# Patient Record
Sex: Female | Born: 1980 | Race: Black or African American | Hispanic: No | Marital: Single | Smoking: Never smoker
Health system: Southern US, Community
[De-identification: ages and names within clinical notes are randomized; demographics above are authoritative.]

## PROBLEM LIST (undated history)

## (undated) DIAGNOSIS — M1990 Unspecified osteoarthritis, unspecified site: Secondary | ICD-10-CM

## (undated) DIAGNOSIS — T7840XA Allergy, unspecified, initial encounter: Secondary | ICD-10-CM

## (undated) DIAGNOSIS — K219 Gastro-esophageal reflux disease without esophagitis: Secondary | ICD-10-CM

## (undated) HISTORY — DX: Allergy, unspecified, initial encounter: T78.40XA

## (undated) HISTORY — DX: Unspecified osteoarthritis, unspecified site: M19.90

---

## 2006-01-14 ENCOUNTER — Emergency Department: Payer: Self-pay | Admitting: Emergency Medicine

## 2007-01-25 ENCOUNTER — Emergency Department: Payer: Self-pay | Admitting: Emergency Medicine

## 2007-11-17 ENCOUNTER — Ambulatory Visit: Payer: Self-pay | Admitting: Unknown Physician Specialty

## 2008-04-11 ENCOUNTER — Emergency Department: Payer: Self-pay | Admitting: Internal Medicine

## 2008-04-28 ENCOUNTER — Ambulatory Visit: Payer: Self-pay

## 2008-05-26 ENCOUNTER — Ambulatory Visit: Payer: Self-pay

## 2010-07-05 ENCOUNTER — Emergency Department: Payer: Self-pay | Admitting: Unknown Physician Specialty

## 2011-07-08 HISTORY — PX: FRACTURE SURGERY: SHX138

## 2011-07-22 DIAGNOSIS — S42309A Unspecified fracture of shaft of humerus, unspecified arm, initial encounter for closed fracture: Secondary | ICD-10-CM | POA: Insufficient documentation

## 2011-07-22 DIAGNOSIS — S42301A Unspecified fracture of shaft of humerus, right arm, initial encounter for closed fracture: Secondary | ICD-10-CM | POA: Insufficient documentation

## 2011-07-22 HISTORY — DX: Unspecified fracture of shaft of humerus, right arm, initial encounter for closed fracture: S42.301A

## 2012-01-03 ENCOUNTER — Emergency Department: Payer: Self-pay | Admitting: Emergency Medicine

## 2012-01-03 LAB — URINALYSIS, COMPLETE
Bilirubin,UR: NEGATIVE
Blood: NEGATIVE
Ketone: NEGATIVE
Ph: 5 (ref 4.5–8.0)
Protein: NEGATIVE
Specific Gravity: 1.025 (ref 1.003–1.030)
WBC UR: 2 /HPF (ref 0–5)

## 2012-01-03 LAB — CBC
HCT: 36.1 % (ref 35.0–47.0)
HGB: 11.8 g/dL — ABNORMAL LOW (ref 12.0–16.0)
MCH: 26.7 pg (ref 26.0–34.0)
MCHC: 32.8 g/dL (ref 32.0–36.0)
MCV: 81 fL (ref 80–100)
Platelet: 167 10*3/uL (ref 150–440)
RBC: 4.43 10*6/uL (ref 3.80–5.20)
RDW: 16.4 % — ABNORMAL HIGH (ref 11.5–14.5)

## 2012-01-03 LAB — COMPREHENSIVE METABOLIC PANEL
Albumin: 3.6 g/dL (ref 3.4–5.0)
Anion Gap: 8 (ref 7–16)
Bilirubin,Total: 0.1 mg/dL — ABNORMAL LOW (ref 0.2–1.0)
Creatinine: 1.12 mg/dL (ref 0.60–1.30)
Potassium: 4 mmol/L (ref 3.5–5.1)
SGOT(AST): 20 U/L (ref 15–37)
SGPT (ALT): 21 U/L (ref 12–78)
Total Protein: 8.6 g/dL — ABNORMAL HIGH (ref 6.4–8.2)

## 2012-07-23 DIAGNOSIS — S42301A Unspecified fracture of shaft of humerus, right arm, initial encounter for closed fracture: Secondary | ICD-10-CM | POA: Insufficient documentation

## 2012-10-30 ENCOUNTER — Emergency Department: Payer: Self-pay | Admitting: Emergency Medicine

## 2012-10-30 LAB — COMPREHENSIVE METABOLIC PANEL
Bilirubin,Total: 0.1 mg/dL — ABNORMAL LOW (ref 0.2–1.0)
Calcium, Total: 8.7 mg/dL (ref 8.5–10.1)
Chloride: 107 mmol/L (ref 98–107)
Co2: 28 mmol/L (ref 21–32)
EGFR (African American): >60
Potassium: 3.8 mmol/L (ref 3.5–5.1)
SGOT(AST): 22 U/L (ref 15–37)

## 2012-10-30 LAB — CBC
HCT: 32.8 % — ABNORMAL LOW (ref 35.0–47.0)
HGB: 11.1 g/dL — ABNORMAL LOW (ref 12.0–16.0)
MCHC: 33.9 g/dL (ref 32.0–36.0)
MCV: 85 fL (ref 80–100)
Platelet: 163 10*3/uL (ref 150–440)
RBC: 3.86 10*6/uL (ref 3.80–5.20)
RDW: 15.5 % — ABNORMAL HIGH (ref 11.5–14.5)

## 2012-10-30 LAB — TROPONIN I: Troponin-I: <0.02 ng/mL

## 2012-10-30 LAB — CK TOTAL AND CKMB (NOT AT ARMC): CK-MB: 1.8 ng/mL (ref 0.5–3.6)

## 2012-10-31 LAB — TROPONIN I: Troponin-I: <0.02 ng/mL

## 2013-08-09 ENCOUNTER — Emergency Department: Payer: Self-pay | Admitting: Emergency Medicine

## 2014-01-29 ENCOUNTER — Emergency Department: Payer: Self-pay | Admitting: Internal Medicine

## 2014-01-29 LAB — CBC WITH DIFFERENTIAL/PLATELET
Basophil #: 0.1 10*3/uL (ref 0.0–0.1)
Basophil %: 0.8 %
EOS ABS: 0.1 10*3/uL (ref 0.0–0.7)
EOS PCT: 1.8 %
HCT: 37 % (ref 35.0–47.0)
HGB: 11.9 g/dL — ABNORMAL LOW (ref 12.0–16.0)
Lymphocyte #: 1.4 10*3/uL (ref 1.0–3.6)
Lymphocyte %: 22.7 %
MCH: 27.3 pg (ref 26.0–34.0)
MCHC: 32.1 g/dL (ref 32.0–36.0)
MCV: 85 fL (ref 80–100)
Monocyte #: 0.2 x10 3/mm (ref 0.2–0.9)
Monocyte %: 2.5 %
NEUTROS ABS: 4.5 10*3/uL (ref 1.4–6.5)
Neutrophil %: 72.2 %
Platelet: 183 10*3/uL (ref 150–440)
RBC: 4.35 10*6/uL (ref 3.80–5.20)
RDW: 15.1 % — ABNORMAL HIGH (ref 11.5–14.5)
WBC: 6.2 10*3/uL (ref 3.6–11.0)

## 2014-03-25 ENCOUNTER — Emergency Department (HOSPITAL_COMMUNITY)
Admission: EM | Admit: 2014-03-25 | Discharge: 2014-03-25 | Disposition: A | Discharge status: Home / Self Care | Payer: No Typology Code available for payment source | Attending: Emergency Medicine | Admitting: Emergency Medicine

## 2014-03-25 ENCOUNTER — Emergency Department (HOSPITAL_COMMUNITY): Payer: No Typology Code available for payment source

## 2014-03-25 ENCOUNTER — Encounter (HOSPITAL_COMMUNITY): Payer: Self-pay | Admitting: Family Medicine

## 2014-03-25 DIAGNOSIS — Y929 Unspecified place or not applicable: Secondary | ICD-10-CM | POA: Insufficient documentation

## 2014-03-25 DIAGNOSIS — Y998 Other external cause status: Secondary | ICD-10-CM | POA: Insufficient documentation

## 2014-03-25 DIAGNOSIS — S39012A Strain of muscle, fascia and tendon of lower back, initial encounter: Secondary | ICD-10-CM | POA: Diagnosis not present

## 2014-03-25 DIAGNOSIS — Y9389 Activity, other specified: Secondary | ICD-10-CM | POA: Insufficient documentation

## 2014-03-25 DIAGNOSIS — S3992XA Unspecified injury of lower back, initial encounter: Secondary | ICD-10-CM | POA: Diagnosis present

## 2014-03-25 MED ORDER — CYCLOBENZAPRINE HCL 5 MG PO TABS: 5.0000 mg | ORAL_TABLET | Freq: Two times a day (BID) | ORAL | Status: DC | PRN

## 2014-03-25 MED ORDER — HYDROCODONE-ACETAMINOPHEN 5-325 MG PO TABS: 2.0000 | ORAL_TABLET | Freq: Four times a day (QID) | ORAL | Status: DC | PRN
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
## 2014-03-25 NOTE — Discharge Instructions (Signed)

## 2014-12-31 ENCOUNTER — Emergency Department
Admission: EM | Admit: 2014-12-31 | Discharge: 2014-12-31 | Disposition: A | Discharge status: Home / Self Care | Payer: Self-pay | Attending: Emergency Medicine | Admitting: Emergency Medicine

## 2014-12-31 ENCOUNTER — Encounter: Payer: Self-pay | Admitting: Emergency Medicine

## 2014-12-31 DIAGNOSIS — I8391 Asymptomatic varicose veins of right lower extremity: Secondary | ICD-10-CM | POA: Insufficient documentation

## 2014-12-31 DIAGNOSIS — I83891 Varicose veins of right lower extremities with other complications: Secondary | ICD-10-CM

## 2014-12-31 LAB — CBC WITH DIFFERENTIAL/PLATELET
BASOS ABS: 0 10*3/uL (ref 0–0.1)
Basophils Relative: 1 %
Eosinophils Absolute: 0.1 10*3/uL (ref 0–0.7)
Eosinophils Relative: 2 %
HCT: 34.1 % — ABNORMAL LOW (ref 35.0–47.0)
Hemoglobin: 10.8 g/dL — ABNORMAL LOW (ref 12.0–16.0)
LYMPHS ABS: 1.1 10*3/uL (ref 1.0–3.6)
LYMPHS PCT: 16 %
MCH: 25.6 pg — AB (ref 26.0–34.0)
MCHC: 31.7 g/dL — ABNORMAL LOW (ref 32.0–36.0)
MCV: 80.6 fL (ref 80.0–100.0)
MONO ABS: 0.2 10*3/uL (ref 0.2–0.9)
Monocytes Relative: 4 %
Neutro Abs: 5.2 10*3/uL (ref 1.4–6.5)
Neutrophils Relative %: 77 %
Platelets: 168 10*3/uL (ref 150–440)
RBC: 4.23 MIL/uL (ref 3.80–5.20)
RDW: 16.7 % — ABNORMAL HIGH (ref 11.5–14.5)
WBC: 6.7 10*3/uL (ref 3.6–11.0)

## 2014-12-31 LAB — COMPREHENSIVE METABOLIC PANEL
ALK PHOS: 71 U/L (ref 38–126)
ALT: 16 U/L (ref 14–54)
AST: 16 U/L (ref 15–41)
Albumin: 3.6 g/dL (ref 3.5–5.0)
Anion gap: 7 (ref 5–15)
BUN: 18 mg/dL (ref 6–20)
CALCIUM: 8.8 mg/dL — AB (ref 8.9–10.3)
CO2: 27 mmol/L (ref 22–32)
CREATININE: 0.9 mg/dL (ref 0.44–1.00)
Chloride: 107 mmol/L (ref 101–111)
Glucose, Bld: 109 mg/dL — ABNORMAL HIGH (ref 65–99)
Potassium: 4 mmol/L (ref 3.5–5.1)
Sodium: 141 mmol/L (ref 135–145)
Total Bilirubin: 0.4 mg/dL (ref 0.3–1.2)
Total Protein: 7.8 g/dL (ref 6.5–8.1)

## 2014-12-31 NOTE — Discharge Instructions (Signed)
Leave the dressing on today. He may stand and walk, but do not stand for prolonged periods of time. Remove the dressing this evening. You may bathe or shower as normal tomorrow. If at any time the bleeding resumes, apply focal direct pressure as we spoke about in the emergency department. You may want to follow-up with vascular surgery to further evaluate your varicose veins for treatment.  Bleeding Varicose Veins Varicose veins are veins that have become enlarged and twisted. Valves in the veins help return blood from the leg to the heart. If these valves are damaged, blood flows backward and backs up into the veins in the leg near the skin. This causes the veins to become larger because of increased pressure within them. Sometimes these veins bleed. CAUSES  Factors that can lead to bleeding varicose veins include:  Thinning of the skin that covers the veins. This skin is stretched as the veins enlarge.  Weak and thinning walls of the varicose veins. These thin walls are part of the reason why blood is not flowing normally to the heart.  Having high pressure in the veins. This high pressure occurs because the blood is not flowing freely back up to the heart.  Injury. Even a small injury to a varicose vein can cause bleeding.  Open wounds. A sore may develop near a varicose vein and not heal. This makes bleeding more likely.  Taking medicine that thins the blood. These medicines may include aspirin, anti-inflammatory medicine, and other blood thinners. SIGNS AND SYMPTOMS  If bleeding is on the outside surface of the skin, blood can be seen. Sometimes, the bleeding stays under the skin. If this happens, the blue or purple area will spread beyond the vein. This discoloration may be visible. DIAGNOSIS  To decide if you have a bleeding varicose vein, your health care provider may:  Ask about your symptoms. This will include when you first saw bleeding.  Ask about how long you have had varicose  veins and if they cause you problems.  Ask about your overall health.  Ask about possible causes, such as recent cuts or if the area near the varicose veins was bumped or injured.  Examine the skin or leg that concerns you. Your health care provider will probably feel the veins.  Order imaging tests. These create detailed pictures of the veins. TREATMENT  The first goal of treating bleeding varicose veins is to stop the bleeding. Then, the aim is to keep any bleeding from happening again. Treatment will depend on the cause of the bleeding and how bad it is. Ask your health care provider about what would be best for you. Options include:  Raising (elevating) your leg. Lie down with your leg propped up on a pillow or cushion. Your foot should be above the level of your heart.  Applying pressure to the spot that is bleeding. The bleeding should stop in a short time.  Wearing elastic stockings that "compress" your legs (compression stockings). An elastic bandage may do the same thing.  Applying an antibiotic cream on sores that are not healing.  Closing off or surgically removing the bleeding varicose veins with one of the following:  Sclerotherapy. A solution is injected into the vein to close it off.  Laser treatment. A laser is used to heat the vein to close it off.  Radiofrequency vein ablation. An electrical current produced by radio waves is used to close off the vein.  Phlebectomy. The vein is surgically removed through small  incisions made over the varicose vein.  Vein ligation and stripping. The vein is surgically removed through incisions made over the varicose vein after the vein has been tied (ligated). HOME CARE INSTRUCTIONS   Apply any creams that your health care provider prescribed. Follow the directions carefully.  Wear compression stockings or any wraps as directed by your health care provider. Make sure you know:  If you should wear them every day.  How long you  should wear them.  If veins were removed or closed, a bandage (dressing) will probably cover the area. Make sure you know:  How often the dressing should be changed.  Whether the area can get wet.  When you can leave the skin uncovered.  Check your skin every day. Look for new sores and signs of bleeding.  To prevent future bleeding:  Use extra care in situations where you could cut your legs, such as when shaving or gardening.  Try to keep your legs elevated as much as possible. Lie down when you can. SEEK MEDICAL CARE IF:   Your veins continue to bleed.  You develop new sores near your varicose veins.  You have a sore that does not heal or gets bigger.  You have increased pain in your leg.  The area around a varicose vein becomes warm, red, or tender to the touch.  You notice a yellowish fluid that smells bad coming from a spot where there was bleeding.  You have a fever. SEEK IMMEDIATE MEDICAL CARE IF:   You have chest pain or difficulty breathing.  You have severe leg pain.   This information is not intended to replace advice given to you by your health care provider. Make sure you discuss any questions you have with your health care provider.   Document Released: 05/12/2008 Document Revised: 01/14/2014 Document Reviewed: 04/27/2013 Elsevier Interactive Patient Education [employer]2016 Elsevier Inc.

## 2014-12-31 NOTE — ED Provider Notes (Signed)
[facility] Emergency Department Provider Note  ____________________________________________  Time seen: 80917  I have reviewed the triage vital signs and the nursing notes.  History by:  Patient  HISTORY  Chief Complaint Bleeding/Bruising     HPI Stacey Hensley is a 34 y.o. female who went to take a shower this morning but began to bleed from her right leg. She has a history of some varicose veins. She is morbidly obese. She reports the bleeding was extensive and her bathroom looks like "a murder scene". She was brought to the hospital by EMS. On arrival, the bleeding was controlled. On exam, I see a few small superficial varicose veins, but I cannot see one that bled with any significant scabbing.   Patient denies any known trauma to the leg this morning. She does reports she's had some bleeding from a varicose vein in the past.   History reviewed. No pertinent past medical history.  Patient is obese  There are no active problems to display for this patient.   History reviewed. No pertinent past surgical history.  Current Outpatient Rx  Name  Route  Sig  Dispense  Refill  . cyclobenzaprine (FLEXERIL) 5 MG tablet   Oral   Take 1 tablet (5 mg total) by mouth 2 (two) times daily as needed for muscle spasms.   15 tablet   0   . HYDROcodone-acetaminophen (NORCO/VICODIN) 5-325 MG per tablet   Oral   Take 2 tablets by mouth every 6 (six) hours as needed.   15 tablet   0     Allergies Percocet and Tape  No family history on file.  Social History Social History  Substance Use Topics  . Smoking status: Never Smoker   . Smokeless tobacco: None  . Alcohol Use: No    Review of Systems  Constitutional: Negative for fever/chills. ENT: Negative for congestion. Cardiovascular: Negative for chest pain. Respiratory: Negative for cough. Gastrointestinal: Negative for abdominal pain, vomiting and diarrhea. Genitourinary: Negative for  dysuria. Musculoskeletal: No back pain. Skin: Bleeding prior to arrival from right leg. See history of present illness. Neurological: Negative for headache or focal weakness   10-point ROS otherwise negative.  ____________________________________________   PHYSICAL EXAM:  VITAL SIGNS: ED Triage Vitals  Enc Vitals Group     BP 12/31/14 0813 157/90 mmHg     Pulse Rate 12/31/14 0813 80     Resp 12/31/14 0813 18     Temp 12/31/14 0813 97.8 F (36.6 C)     Temp Source 12/31/14 0813 Oral     SpO2 12/31/14 0813 97 %     Weight 12/31/14 0813 400 lb (181.439 kg)     Height 12/31/14 0813 5\' 9"  (1.753 m)     Head Cir --      Peak Flow --      Pain Score 12/31/14 0820 0     Pain Loc --      Pain Edu? --      Excl. in GC? --     Constitutional:  Alert and oriented. Large body habitus Well appearing and in no distress. ENT   Head: Normocephalic and atraumatic. Cardiovascular: Normal rate, regular rhythm, no murmur noted Respiratory:  Normal respiratory effort, no tachypnea.    Breath sounds are clear and equal bilaterally.  Gastrointestinal: Soft, no distention. Nontender Back: No muscle spasm, no tenderness, no CVA tenderness. Musculoskeletal: No deformity noted. Nontender with normal range of motion in all extremities.  No noted edema. There  are a number of varicose veins palpable and visible on the lateral posterior portion of the patient's right calf. There is no bleeding or scabbing in this area. Neurologic:  Communicative. Normal appearing spontaneous movement in all 4 extremities. No gross focal neurologic deficits are appreciated.  Skin:  Skin is warm, dry. No rash noted. Varicose veins on the right calf, lateral posterior, as mentioned above. No scabbing or bleeding. No erythema. Psychiatric: Mood and affect are normal. Speech and behavior are normal.  ____________________________________________    LABS (pertinent positives/negatives)  Labs Reviewed  CBC WITH  DIFFERENTIAL/PLATELET - Abnormal; Notable for the following:    Hemoglobin 10.8 (*)    HCT 34.1 (*)    MCH 25.6 (*)    MCHC 31.7 (*)    RDW 16.7 (*)    All other components within normal limits  COMPREHENSIVE METABOLIC PANEL     ____________________________________________    INITIAL IMPRESSION / ASSESSMENT AND PLAN / ED COURSE  Pertinent labs & imaging results that were available during my care of the patient were reviewed by me and considered in my medical decision making (see chart for details).  Well-appearing 34 year old female in no acute distress with no active bleeding. She had a varicose vein bleed, based on history and exam, prior to arrival. She has no active bleeding currently. Her hemoglobin is 10.8.  I counseled the patient on what to do if one of the varicose veins bleed skin. I've also applied Vaseline gauze over the 2 areas of varicose veins that have been noted and then placed tubular gauze over her calf. This is intended to help protect the leg through the day. I've instructed her to remove this is evening and then she may bathe or shower normally tomorrow morning. A voided this will lead any clot that formed solidified so she will not have a repeat episode.  ____________________________________________   FINAL CLINICAL IMPRESSION(S) / ED DIAGNOSES  Final diagnoses:  Bleeding from varicose vein, right      Darien Ramus, MD 12/31/14 [phone]

## 2014-12-31 NOTE — ED Notes (Signed)
Pt states she had just gotten out of the shower, and realized her right lower leg was "squirting blood." Pt denies pain, states a varicose vein has busted in her leg once before.

## 2015-01-25 ENCOUNTER — Encounter: Payer: Self-pay | Admitting: *Deleted

## 2015-01-25 ENCOUNTER — Emergency Department
Admission: EM | Admit: 2015-01-25 | Discharge: 2015-01-25 | Disposition: A | Discharge status: Home / Self Care | Payer: Self-pay | Attending: Emergency Medicine | Admitting: Emergency Medicine

## 2015-01-25 DIAGNOSIS — R202 Paresthesia of skin: Secondary | ICD-10-CM | POA: Insufficient documentation

## 2015-01-25 MED ORDER — TRAMADOL HCL 50 MG PO TABS
50.0000 mg | ORAL_TABLET | Freq: Once | ORAL | Status: AC
  Administered 2015-01-25: 50 mg via ORAL
  Filled 2015-01-25: qty 1

## 2015-01-25 MED ORDER — METHYLPREDNISOLONE 4 MG PO TBPK: ORAL_TABLET | ORAL | Status: DC

## 2015-01-25 MED ORDER — DEXAMETHASONE SODIUM PHOSPHATE 10 MG/ML IJ SOLN
10.0000 mg | Freq: Once | INTRAMUSCULAR | Status: AC
  Administered 2015-01-25: 10 mg via INTRAMUSCULAR
  Filled 2015-01-25: qty 1

## 2015-01-25 MED ORDER — TRAMADOL HCL 50 MG PO TABS: 50.0000 mg | ORAL_TABLET | Freq: Four times a day (QID) | ORAL | Status: DC | PRN

## 2015-01-25 NOTE — ED Notes (Signed)
Pt states left shoulder pain shooting down her arm, burning pain that stays with no relief, denies any injury to the arm

## 2015-01-25 NOTE — Discharge Instructions (Signed)
Paresthesia  Paresthesia is a burning or prickling feeling. This feeling can happen in any part of the body. It often happens in the hands, arms, legs, or feet. Usually, it is not painful. In most cases, the feeling goes away in a short time and is not a sign of a serious problem.  HOME CARE  · Avoid drinking alcohol.  · Try massage or needle therapy (acupuncture) to help with your problems.  · Keep all follow-up visits as told by your doctor. This is important.  GET HELP IF:  · You keep on having episodes of paresthesia.  · Your burning or prickling feeling gets worse when you walk.  · You have pain or cramps.  · You feel dizzy.  · You have a rash.  GET HELP RIGHT AWAY IF:  · You feel weak.  · You have trouble walking or moving.  · You have problems speaking, understanding, or seeing.  · You feel confused.  · You cannot control when you pee (urinate) or poop (bowel movement).  · You lose feeling (numbness) after an injury.  · You pass out (faint).     This information is not intended to replace advice given to you by your health care provider. Make sure you discuss any questions you have with your health care provider.     Document Released: 12/07/2007 Document Revised: 05/10/2014 Document Reviewed: 12/20/2013  Elsevier Interactive Patient Education ©2016 Elsevier Inc.

## 2015-01-25 NOTE — ED Provider Notes (Signed)
[facility] Emergency Department Provider Note  ____________________________________________  Time seen: Approximately 4:20 PM  I have reviewed the triage vital signs and the nursing notes.   HISTORY  Chief Complaint Shoulder Pain    HPI Stacey Hensley is a 35 y.o. female patient complaining of a shooting pain discussed this may scapular area runs down her left arm for 5 days. Patient states she took hepatitis shot 2 days ago was seen increase her pain. Patient states she is using anti-inflammatory disease and muscle relaxants with no relief. She denies any injury to her arm. Patient state the pain is now 10 over 10. Patient denies any loss of strength or range of motion. Patient stated there is some mild relief by holding the arm in adduction.   History reviewed. No pertinent past medical history.  There are no active problems to display for this patient.   History reviewed. No pertinent past surgical history.  Current Outpatient Rx  Name  Route  Sig  Dispense  Refill  . cyclobenzaprine (FLEXERIL) 5 MG tablet   Oral   Take 1 tablet (5 mg total) by mouth 2 (two) times daily as needed for muscle spasms.   15 tablet   0   . HYDROcodone-acetaminophen (NORCO/VICODIN) 5-325 MG per tablet   Oral   Take 2 tablets by mouth every 6 (six) hours as needed.   15 tablet   0   . methylPREDNISolone (MEDROL DOSEPAK) 4 MG TBPK tablet      Take Tapered dose as directed   21 tablet   0   . traMADol (ULTRAM) 50 MG tablet   Oral   Take 1 tablet (50 mg total) by mouth every 6 (six) hours as needed for moderate pain.   12 tablet   0     Allergies Percocet and Tape  History reviewed. No pertinent family history.  Social History Social History  Substance Use Topics  . Smoking status: Never Smoker   . Smokeless tobacco: None  . Alcohol Use: No    Review of Systems Constitutional: No fever/chills Eyes: No visual changes .ENT: No sore  throat. Cardiovascular: Denies chest pain. Respiratory: Denies shortness of breath. Gastrointestinal: No abdominal pain.  No nausea, no vomiting.  No diarrhea.  No constipation. Genitourinary: Negative for dysuria. Musculoskeletal: Left upper extremity pain Skin: Negative for rash. Neurological: Negative for headaches, focal weakness or numbness. 10-point ROS otherwise negative.  ____________________________________________   PHYSICAL EXAM:  VITAL SIGNS: ED Triage Vitals  Enc Vitals Group     BP 01/25/15 1556 132/56 mmHg     Pulse Rate 01/25/15 1556 88     Resp 01/25/15 1556 18     Temp 01/25/15 1556 98.1 F (36.7 C)     Temp Source 01/25/15 1556 Oral     SpO2 01/25/15 1556 98 %     Weight 01/25/15 1556 400 lb (181.439 kg)     Height 01/25/15 1556  (1.753 m)     Head Cir --      Peak Flow --      Pain Score 01/25/15 1557 10     Pain Loc --      Pain Edu? --      Excl. in GC? --     Constitutional: Alert and oriented. Well appearing and in no acute distress. Eyes: Conjunctivae are normal. PERRL. EOMI. Head: Atraumatic. Nose: No congestion/rhinnorhea. Mouth/Throat: Mucous membranes are moist.  Oropharynx non-erythematous. Neck: No stridor. No cervical spine tenderness to  palpation. Hematological/Lymphatic/Immunilogical: No cervical lymphadenopathy. Cardiovascular: Normal rate, regular rhythm. Grossly normal heart sounds.  Good peripheral circulation. Respiratory: Normal respiratory effort.  No retractions. Lungs CTAB. Gastrointestinal: Soft and nontender. No distention. No abdominal bruits. No CVA tenderness. Musculoskeletal: No obvious deformity of the left upper extremity. No edema or erythema. Neurovascular intact. Patient is free and equal range of motion. Neurologic:  Normal speech and language. No gross focal neurologic deficits are appreciated. No gait instability. Skin:  Skin is warm, dry and intact. No rash noted. Psychiatric: Mood and affect are normal.  Speech and behavior are normal.  ____________________________________________   LABS (all labs ordered are listed, but only abnormal results are displayed)  Labs Reviewed - No data to display ____________________________________________  EKG  ____________________________________________  RADIOLOGY   ____________________________________________   PROCEDURES  Procedure(s) performed: None  Critical Care performed: No  ____________________________________________   INITIAL IMPRESSION / ASSESSMENT AND PLAN / ED COURSE  Pertinent labs & imaging results that were available during my care of the patient were reviewed by me and considered in my medical decision making (see chart for details).  Paresthesia left arm. Patient given discharge Instructions. Patient given arm sling for comfort. Patient given a prescription Medrol Dosepak and tramadol. Patient advised follow-up with [facility] clinic if no improvement in her complaint. ____________________________________________   FINAL CLINICAL IMPRESSION(S) / ED DIAGNOSES  Final diagnoses:  Paresthesia of left arm      Joni Reining, PA-C 01/25/15 1629  Jennye Moccasin, MD 01/31/15 [id number]

## 2018-05-14 ENCOUNTER — Encounter: Payer: Self-pay | Admitting: *Deleted

## 2018-05-14 ENCOUNTER — Other Ambulatory Visit: Payer: Self-pay

## 2018-05-14 ENCOUNTER — Emergency Department
Admission: EM | Admit: 2018-05-14 | Discharge: 2018-05-14 | Disposition: A | Discharge status: Home / Self Care | Payer: Self-pay | Attending: Emergency Medicine | Admitting: Emergency Medicine

## 2018-05-14 ENCOUNTER — Emergency Department: Payer: Self-pay

## 2018-05-14 DIAGNOSIS — Y999 Unspecified external cause status: Secondary | ICD-10-CM | POA: Insufficient documentation

## 2018-05-14 DIAGNOSIS — S39012A Strain of muscle, fascia and tendon of lower back, initial encounter: Secondary | ICD-10-CM | POA: Insufficient documentation

## 2018-05-14 DIAGNOSIS — Y92410 Unspecified street and highway as the place of occurrence of the external cause: Secondary | ICD-10-CM | POA: Insufficient documentation

## 2018-05-14 DIAGNOSIS — Y939 Activity, unspecified: Secondary | ICD-10-CM | POA: Insufficient documentation

## 2018-05-14 DIAGNOSIS — S161XXA Strain of muscle, fascia and tendon at neck level, initial encounter: Secondary | ICD-10-CM | POA: Insufficient documentation

## 2018-05-14 LAB — POCT PREGNANCY, URINE: Preg Test, Ur: NEGATIVE

## 2018-05-14 MED ORDER — MELOXICAM 15 MG PO TABS: 15.0000 mg | ORAL_TABLET | Freq: Every day | ORAL | 0 refills | Status: DC

## 2018-05-14 MED ORDER — MELOXICAM 7.5 MG PO TABS
15.0000 mg | ORAL_TABLET | Freq: Once | ORAL | Status: AC
  Administered 2018-05-14: 15 mg via ORAL
  Filled 2018-05-14: qty 2

## 2018-05-14 MED ORDER — METHOCARBAMOL 500 MG PO TABS: 500.0000 mg | ORAL_TABLET | Freq: Four times a day (QID) | ORAL | 0 refills | Status: DC

## 2018-05-14 MED ORDER — METHOCARBAMOL 500 MG PO TABS
1000.0000 mg | ORAL_TABLET | Freq: Once | ORAL | Status: AC
  Administered 2018-05-14: 1000 mg via ORAL
  Filled 2018-05-14: qty 2
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
## 2018-08-27 ENCOUNTER — Other Ambulatory Visit: Payer: Self-pay

## 2018-08-27 DIAGNOSIS — Z20822 Contact with and (suspected) exposure to covid-19: Secondary | ICD-10-CM

## 2018-08-28 LAB — NOVEL CORONAVIRUS, NAA: SARS-CoV-2, NAA: NOT DETECTED

## 2018-09-01 ENCOUNTER — Telehealth: Payer: Self-pay

## 2018-09-01 NOTE — Telephone Encounter (Signed)
Negative COVID results given. Patient results "NOT Detected." Caller expressed understanding. ° °

## 2019-05-31 DIAGNOSIS — I83893 Varicose veins of bilateral lower extremities with other complications: Secondary | ICD-10-CM | POA: Insufficient documentation

## 2019-08-23 DIAGNOSIS — R87616 Satisfactory cervical smear but lacking transformation zone: Secondary | ICD-10-CM | POA: Insufficient documentation
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
## 2021-07-06 ENCOUNTER — Ambulatory Visit
Admission: EM | Admit: 2021-07-06 | Discharge: 2021-07-06 | Disposition: A | Discharge status: Home / Self Care | Payer: BC Managed Care – PPO | Attending: Emergency Medicine | Admitting: Emergency Medicine

## 2021-07-06 ENCOUNTER — Ambulatory Visit (INDEPENDENT_AMBULATORY_CARE_PROVIDER_SITE_OTHER): Payer: BC Managed Care – PPO

## 2021-07-06 ENCOUNTER — Encounter: Payer: Self-pay | Admitting: Emergency Medicine

## 2021-07-06 DIAGNOSIS — M25561 Pain in right knee: Secondary | ICD-10-CM

## 2021-07-06 DIAGNOSIS — R030 Elevated blood-pressure reading, without diagnosis of hypertension: Secondary | ICD-10-CM | POA: Diagnosis not present

## 2021-07-06 DIAGNOSIS — M1711 Unilateral primary osteoarthritis, right knee: Secondary | ICD-10-CM

## 2021-07-06 DIAGNOSIS — M25461 Effusion, right knee: Secondary | ICD-10-CM

## 2021-07-06 NOTE — ED Provider Notes (Signed)
Renaldo Fiddler    CSN: [id number] Arrival date & time: 07/06/21  1244      History   Chief Complaint Chief Complaint  Patient presents with   Knee Pain    HPI Stacey Hensley is a 41 y.o. female.  Patient presents with 1 week history of right knee pain.  No falls or injury.   The pain of her knee is generalized with no focal area of pain; it radiates to her foot.  No wounds, bruising, redness, fever, chills, numbness, weakness, or other symptoms.  Treatment attempted with Tylenol.  Her medical history includes morbid obesity and varicose veins of bilateral lower extremities.  She denies current pregnancy or breastfeeding.     The history is provided by the patient and medical records.    History reviewed. No pertinent past medical history.  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) 05/31/2019   Varicose veins of bilateral lower extremities with other complications 05/31/2019   Fracture of humerus, right, closed 07/23/2012   Humerus fracture 07/22/2011    History reviewed. No pertinent surgical history.  OB History   No obstetric history on file.      Home Medications    Prior to Admission medications   Medication Sig Start Date End Date Taking? Authorizing Provider  cyclobenzaprine (FLEXERIL) 5 MG tablet Take 1 tablet (5 mg total) by mouth 2 (two) times daily as needed for muscle spasms. 03/25/14   Teressa Lower, NP  HYDROcodone-acetaminophen (NORCO/VICODIN) 5-325 MG per tablet Take 2 tablets by mouth every 6 (six) hours as needed. 03/25/14   Teressa Lower, NP  meloxicam (MOBIC) 15 MG tablet Take 1 tablet (15 mg total) by mouth daily. 05/14/18   Cuthriell, Delorise Royals, PA-C  methocarbamol (ROBAXIN) 500 MG tablet Take 1 tablet (500 mg total) by mouth 4 (four) times daily. 05/14/18   Cuthriell, Delorise Royals, PA-C   methylPREDNISolone (MEDROL DOSEPAK) 4 MG TBPK tablet Take Tapered dose as directed 01/25/15   Joni Reining, PA-C  traMADol (ULTRAM) 50 MG tablet Take 1 tablet (50 mg total) by mouth every 6 (six) hours as needed for moderate pain. 01/25/15   Joni Reining, PA-C    Family History History reviewed. No pertinent family history.  Social History Social History   Tobacco Use   Smoking status: Never   Smokeless tobacco: Never  Substance Use Topics   Alcohol use: No   Drug use: No     Allergies   Oxycodone-acetaminophen, Percocet [oxycodone-acetaminophen], Tape, and Tapentadol   Review of Systems Review of Systems  Constitutional:  Negative for chills and fever.  Musculoskeletal:  Positive for arthralgias, gait problem and joint swelling.  Skin:  Negative for color change, rash and wound.  Neurological:  Negative for weakness and numbness.  All other systems reviewed and are negative.    Physical Exam Triage Vital Signs ED Triage Vitals  Enc Vitals Group     BP      Pulse      Resp      Temp      Temp src      SpO2      Weight      Height      Head Circumference      Peak Flow      Pain Score      Pain Loc  Pain Edu?      Excl. in GC?    No data found.  Updated Vital Signs BP (!) 160/95   Pulse 89   Temp 97.8 F (36.6 C)   Resp 18   SpO2 97%   Visual Acuity Right Eye Distance:   Left Eye Distance:   Bilateral Distance:    Right Eye Near:   Left Eye Near:    Bilateral Near:     Physical Exam Vitals and nursing note reviewed.  Constitutional:      General: She is not in acute distress.    Appearance: She is well-developed. She is obese. She is not ill-appearing.  HENT:     Mouth/Throat:     Mouth: Mucous membranes are moist.  Cardiovascular:     Rate and Rhythm: Normal rate and regular rhythm.     Heart sounds: Normal heart sounds.  Pulmonary:     Effort: Pulmonary effort is normal. No respiratory distress.     Breath sounds: Normal  breath sounds.  Musculoskeletal:        General: Swelling and tenderness present. No deformity or signs of injury. Normal range of motion.     Cervical back: Neck supple.     Comments: Right knee: generalized tenderness to palpation; mild edema. No erythema, ecchymosis, wounds.    Skin:    General: Skin is warm and dry.     Capillary Refill: Capillary refill takes less than 2 seconds.     Findings: No bruising, erythema, lesion or rash.  Neurological:     General: No focal deficit present.     Mental Status: She is alert and oriented to person, place, and time.     Sensory: No sensory deficit.     Motor: No weakness.     Gait: Gait normal.  Psychiatric:        Mood and Affect: Mood normal.        Behavior: Behavior normal.      UC Treatments / Results  Labs (all labs ordered are listed, but only abnormal results are displayed) Labs Reviewed - No data to display  EKG   Radiology DG Knee Complete 4 Views Right  Result Date: 07/06/2021 CLINICAL DATA:  Acute pain of right knee. EXAM: RIGHT KNEE - COMPLETE 4+ VIEW COMPARISON:  None Available. FINDINGS: Negative for acute fracture or dislocation. Small suprapatellar joint effusion. Osteophytosis in the knee compartments with normal alignment. Enthesopathic changes along the anterior aspect of the patella. IMPRESSION: Mild osteoarthritis in the right knee with small joint effusion. No acute bone abnormality. Electronically Signed   By: Richarda Overlie M.D.   On: 07/06/2021 13:24    Procedures Procedures (including critical care time)  Medications Ordered in UC Medications - No data to display  Initial Impression / Assessment and Plan / UC Course  I have reviewed the triage vital signs and the nursing notes.  Pertinent labs & imaging results that were available during my care of the patient were reviewed by me and considered in my medical decision making (see chart for details).   Right knee pain, osteoarthritis, right knee effusion.  Elevated blood pressure reading.  X-ray of the right knee shows mild osteoarthritis and small joint effusion.  Discussed findings with patient.  Treating with rest, elevation, ice packs, Ace wrap, ibuprofen.  Instructed patient to follow-up with an orthopedist and contact information for on-call Ortho provided.  Also discussed with patient that her blood pressure is elevated today and needs to be  rechecked by a PCP in 2 to 4 weeks.  Education provided on preventing hypertension.  Patient does not currently have a PCP; [place] assistance with establishing a PCP requested.  Patient agrees to plan of care.  Final Clinical Impressions(s) / UC Diagnoses   Final diagnoses:  Acute pain of right knee  Osteoarthritis of right knee, unspecified osteoarthritis type  Effusion of right knee  Elevated blood pressure reading     Discharge Instructions      Take ibuprofen as directed.  Rest and elevate your knee.  Apply ice packs 2-3 times a day for up to 20 minutes each.  Wear the ace wrap as needed for comfort.  Follow up with an orthopedist.    Your blood pressure is elevated today at 178/95; repeat 160/95.  Please have this rechecked by your primary care provider in 2-4 weeks.          ED Prescriptions   None    I have reviewed the PDMP during this encounter.   Sharion Balloon, NP 07/06/21 [phone]

## 2021-07-06 NOTE — ED Triage Notes (Signed)
Pt here with right posterior and anterior knee pain that shoots down leg x 1 week.

## 2021-07-06 NOTE — Discharge Instructions (Addendum)
Take ibuprofen as directed.  Rest and elevate your knee.  Apply ice packs 2-3 times a day for up to 20 minutes each.  Wear the ace wrap as needed for comfort.  Follow up with an orthopedist.    Your blood pressure is elevated today at 178/95; repeat 160/95.  Please have this rechecked by your primary care provider in 2-4 weeks.

## 2021-10-01 ENCOUNTER — Encounter: Payer: Self-pay | Admitting: Family Medicine

## 2021-10-01 ENCOUNTER — Ambulatory Visit: Payer: BC Managed Care – PPO | Admitting: Family Medicine

## 2021-10-01 VITALS — BP 160/100 | HR 88 | Ht 69.0 in | Wt >= 6400 oz

## 2021-10-01 DIAGNOSIS — E662 Morbid (severe) obesity with alveolar hypoventilation: Secondary | ICD-10-CM | POA: Diagnosis not present

## 2021-10-01 DIAGNOSIS — Z6841 Body Mass Index (BMI) 40.0 and over, adult: Secondary | ICD-10-CM

## 2021-10-01 DIAGNOSIS — Z13220 Encounter for screening for lipoid disorders: Secondary | ICD-10-CM

## 2021-10-01 DIAGNOSIS — Z1159 Encounter for screening for other viral diseases: Secondary | ICD-10-CM

## 2021-10-01 DIAGNOSIS — G4733 Obstructive sleep apnea (adult) (pediatric): Secondary | ICD-10-CM

## 2021-10-01 DIAGNOSIS — I10 Essential (primary) hypertension: Secondary | ICD-10-CM | POA: Diagnosis not present

## 2021-10-01 DIAGNOSIS — R7989 Other specified abnormal findings of blood chemistry: Secondary | ICD-10-CM | POA: Diagnosis not present

## 2021-10-01 DIAGNOSIS — E66813 Obesity, class 3: Secondary | ICD-10-CM | POA: Insufficient documentation

## 2021-10-01 DIAGNOSIS — Z114 Encounter for screening for human immunodeficiency virus [HIV]: Secondary | ICD-10-CM

## 2021-10-01 HISTORY — DX: Essential (primary) hypertension: I10

## 2021-10-01 HISTORY — DX: Obstructive sleep apnea (adult) (pediatric): G47.33

## 2021-10-01 MED ORDER — LISINOPRIL-HYDROCHLOROTHIAZIDE 10-12.5 MG PO TABS: 1.0000 | ORAL_TABLET | Freq: Every day | ORAL | 0 refills | Status: DC

## 2021-10-01 NOTE — Assessment & Plan Note (Signed)
Risk stratification labs obtained, lifestyle interventions discussed, see additional assessment(s) for plan details.

## 2021-10-01 NOTE — Patient Instructions (Addendum)
-   Obtain fasting labs with orders provided (can have water or black coffee but otherwise no food or drink x 8 hours before labs) - Start blood pressure medication daily (use contraception / abstinence while on medication) - Review information provided - Referral coordinator will schedule visit with sleep medicine group - Return in 1 month - Contact us for any questions between now and then

## 2021-10-01 NOTE — Assessment & Plan Note (Signed)
Chronic condition, symptoms involve fatigue, intermittent headaches, denies any chest pain.  Has not been on treatment in the past, serial measurements under chart review do reveal the chronicity.  Examination reveals no JVD, no carotid bruits, symmetric pulses in the extremities, she has trace-1+ pitting edema bilateral ankles, positive S1 and S2, regular rate and rhythm, no additional heart sounds, lung fields do reveal clear air entry bilaterally.  At this stage, plan for risk stratification labs, initiation of lisinopril-HCTZ 10-12.5 mg daily, discussion about lifestyle interventions, lastly referral to sleep medicine as she has a prior diagnosis of obstructive sleep apnea and previously utilize CPAP, does not have this device anymore.  She is amenable to restarting usage if appropriate.

## 2021-10-01 NOTE — Progress Notes (Signed)
Primary Care / Sports Medicine Office Visit  Patient Information:  Patient ID: Stacey Hensley, female DOB: 1980/05/29 Age: 41 y.o. MRN: [id number]   Stacey Hensley is a pleasant 41 y.o. female presenting with the following:  Chief Complaint  Patient presents with   Establish Care   Hypertension    Martin Majestic to Urgent care in June BP was high, was told to follow up with PCP.    Knee Pain    Right knee, was in UC in June and was told she had fluid on knee, fell on knee and states she is much better now.     Vitals:   10/01/21 1034  BP: (!) 160/100  Pulse: 88  SpO2: 98%   Vitals:   10/01/21 1034  Weight: (!) 424 lb (192.3 kg)  Height: 5\' 9"  (1.753 m)   Body mass index is 62.61 kg/m.  No results found.   Independent interpretation of notes and tests performed by another provider:   None  Procedures performed:   None  Pertinent History, Exam, Impression, and Recommendations:   Problem List Items Addressed This Visit       Cardiovascular and Mediastinum   Hypertension - Primary    Chronic condition, symptoms involve fatigue, intermittent headaches, denies any chest pain.  Has not been on treatment in the past, serial measurements under chart review do reveal the chronicity.  Examination reveals no JVD, no carotid bruits, symmetric pulses in the extremities, she has trace-1+ pitting edema bilateral ankles, positive S1 and S2, regular rate and rhythm, no additional heart sounds, lung fields do reveal clear air entry bilaterally.  At this stage, plan for risk stratification labs, initiation of lisinopril-HCTZ 10-12.5 mg daily, discussion about lifestyle interventions, lastly referral to sleep medicine as she has a prior diagnosis of obstructive sleep apnea and previously utilize CPAP, does not have this device anymore.  She is amenable to restarting usage if appropriate.      Relevant Medications   lisinopril-hydrochlorothiazide (ZESTORETIC) 10-12.5 MG tablet    Other Relevant Orders   Comprehensive metabolic panel   CBC   Lipid panel   TSH   VITAMIN D 25 Hydroxy (Vit-D Deficiency, Fractures)     Respiratory   Class 3 obesity with alveolar hypoventilation, serious comorbidity, and body mass index (BMI) of 60.0 to 69.9 in adult [facility])    Risk stratification labs obtained, lifestyle interventions discussed, see additional assessment(s) for plan details.      Relevant Orders   Comprehensive metabolic panel   CBC   Lipid panel   TSH   VITAMIN D 25 Hydroxy (Vit-D Deficiency, Fractures)   Ambulatory referral to Sleep Studies   OSA (obstructive sleep apnea)    Chronic condition, prior diagnosis, previously had been utilizing CPAP, no longer has a device.  We discussed the relation between obstructive sleep apnea and hypertension.  A referral to sleep medicine has been placed for further evaluation and management options.      Relevant Orders   Ambulatory referral to Sleep Studies   Other Visit Diagnoses     Screening for lipoid disorders       Relevant Orders   Comprehensive metabolic panel   Lipid panel   Low serum vitamin D       Relevant Orders   VITAMIN D 25 Hydroxy (Vit-D Deficiency, Fractures)   Screening for HIV (human immunodeficiency virus)       Relevant Orders   HIV Antibody (routine testing w rflx)  Need for hepatitis C screening test       Relevant Orders   Hepatitis C antibody        Orders & Medications Meds ordered this encounter  Medications   lisinopril-hydrochlorothiazide (ZESTORETIC) 10-12.5 MG tablet    Sig: Take 1 tablet by mouth daily.    Dispense:  30 tablet    Refill:  0   Orders Placed This Encounter  Procedures   HM PAP SMEAR   Comprehensive metabolic panel   Hepatitis C antibody   CBC   Lipid panel   HIV Antibody (routine testing w rflx)   TSH   VITAMIN D 25 Hydroxy (Vit-D Deficiency, Fractures)   Ambulatory referral to Sleep Studies     Return in about 4 weeks (around 10/29/2021).      Jerrol Banana, MD   Primary Care Sports Medicine [facility]

## 2021-10-01 NOTE — Assessment & Plan Note (Signed)
Chronic condition, prior diagnosis, previously had been utilizing CPAP, no longer has a device.  We discussed the relation between obstructive sleep apnea and hypertension.  A referral to sleep medicine has been placed for further evaluation and management options.

## 2021-10-29 ENCOUNTER — Ambulatory Visit: Payer: BC Managed Care – PPO | Admitting: Family Medicine

## 2021-10-29 ENCOUNTER — Encounter: Payer: Self-pay | Admitting: Family Medicine

## 2021-10-29 VITALS — BP 148/92 | HR 78 | Ht 69.0 in | Wt >= 6400 oz

## 2021-10-29 DIAGNOSIS — Z13220 Encounter for screening for lipoid disorders: Secondary | ICD-10-CM | POA: Diagnosis not present

## 2021-10-29 DIAGNOSIS — J00 Acute nasopharyngitis [common cold]: Secondary | ICD-10-CM | POA: Diagnosis not present

## 2021-10-29 DIAGNOSIS — J309 Allergic rhinitis, unspecified: Secondary | ICD-10-CM | POA: Insufficient documentation

## 2021-10-29 DIAGNOSIS — E662 Morbid (severe) obesity with alveolar hypoventilation: Secondary | ICD-10-CM | POA: Diagnosis not present

## 2021-10-29 DIAGNOSIS — Z23 Encounter for immunization: Secondary | ICD-10-CM

## 2021-10-29 DIAGNOSIS — I10 Essential (primary) hypertension: Secondary | ICD-10-CM

## 2021-10-29 DIAGNOSIS — Z6841 Body Mass Index (BMI) 40.0 and over, adult: Secondary | ICD-10-CM | POA: Diagnosis not present

## 2021-10-29 DIAGNOSIS — Z114 Encounter for screening for human immunodeficiency virus [HIV]: Secondary | ICD-10-CM | POA: Diagnosis not present

## 2021-10-29 DIAGNOSIS — Z1159 Encounter for screening for other viral diseases: Secondary | ICD-10-CM | POA: Diagnosis not present

## 2021-10-29 DIAGNOSIS — R7989 Other specified abnormal findings of blood chemistry: Secondary | ICD-10-CM | POA: Diagnosis not present

## 2021-10-29 MED ORDER — LISINOPRIL-HYDROCHLOROTHIAZIDE 20-25 MG PO TABS: 1.0000 | ORAL_TABLET | Freq: Every day | ORAL | 0 refills | Status: DC

## 2021-10-29 MED ORDER — PROMETHAZINE-DM 6.25-15 MG/5ML PO SYRP: 5.0000 mL | ORAL_SOLUTION | Freq: Four times a day (QID) | ORAL | 0 refills | Status: DC | PRN

## 2021-10-29 NOTE — Patient Instructions (Signed)
-   Take new strength of BP mediation - Start Flonase and Claritin daily x 1 week - Can dose Rx cough medicine as-needed - Contact us if cough symptoms persist by this Thursday / Friday

## 2021-10-29 NOTE — Assessment & Plan Note (Signed)
Chronic, improved, cardiopulmonary exam benign and without complaints. She will proceed with risk stratification labs, increase lisinopril-HCTZ and close follow-up.

## 2021-10-29 NOTE — Assessment & Plan Note (Signed)
2 weeks history of afebrile congestion, night cough, mildly productive dark sputum, no SOA, facial pressure or sick contacts. Exam with nasal turbinate erythema, otherwise nonfocal. Lung fields clear. Plan for PRN Rx antitussive, scheduled Flonase and antihistamine, contact if still symptomatic within 1 week, infectious etiology to be considered then.

## 2021-10-29 NOTE — Progress Notes (Signed)
     Primary Care / Sports Medicine Office Visit  Patient Information:  Patient ID: Stacey Hensley, female DOB: Jun 05, 1980 Age: 41 y.o. MRN: [id number]   Stacey Hensley is a pleasant 41 y.o. female presenting with the following:  Chief Complaint  Patient presents with   Hypertension    Tolerating medicaiton well.     Vitals:   10/29/21 1041  BP: (!) 148/92  Pulse: 78  SpO2: 99%   Vitals:   10/29/21 1041  Weight: (!) 421 lb (191 kg)  Height: 5\' 9"  (1.753 m)   Body mass index is 62.17 kg/m.  No results found.   Independent interpretation of notes and tests performed by another provider:   None  Procedures performed:   None  Pertinent History, Exam, Impression, and Recommendations:   Problem List Items Addressed This Visit       Cardiovascular and Mediastinum   Hypertension - Primary    Chronic, improved, cardiopulmonary exam benign and without complaints. She will proceed with risk stratification labs, increase lisinopril-HCTZ and close follow-up.      Relevant Medications   lisinopril-hydrochlorothiazide (ZESTORETIC) 20-25 MG tablet     Respiratory   Acute rhinitis    2 weeks history of afebrile congestion, night cough, mildly productive dark sputum, no SOA, facial pressure or sick contacts. Exam with nasal turbinate erythema, otherwise nonfocal. Lung fields clear. Plan for PRN Rx antitussive, scheduled Flonase and antihistamine, contact if still symptomatic within 1 week, infectious etiology to be considered then.      Relevant Medications   promethazine-dextromethorphan (PROMETHAZINE-DM) 6.25-15 MG/5ML syrup     Other   Need for Tdap vaccination    Administered today.      Relevant Orders   Tdap vaccine greater than or equal to 7yo IM (Completed)     Orders & Medications Meds ordered this encounter  Medications   lisinopril-hydrochlorothiazide (ZESTORETIC) 20-25 MG tablet    Sig: Take 1 tablet by mouth daily.    Dispense:  90 tablet     Refill:  0   promethazine-dextromethorphan (PROMETHAZINE-DM) 6.25-15 MG/5ML syrup    Sig: Take 5 mLs by mouth 4 (four) times daily as needed for cough.    Dispense:  118 mL    Refill:  0   Orders Placed This Encounter  Procedures   Tdap vaccine greater than or equal to 7yo IM     Return in about 3 months (around 01/29/2022).     Montel Culver, MD   Primary Care Sports Medicine [place]

## 2021-10-29 NOTE — Assessment & Plan Note (Signed)
Administered today.

## 2021-10-30 ENCOUNTER — Other Ambulatory Visit: Payer: Self-pay | Admitting: Family Medicine

## 2021-10-30 LAB — VITAMIN D 25 HYDROXY (VIT D DEFICIENCY, FRACTURES): Vit D, 25-Hydroxy: 14.2 ng/mL — ABNORMAL LOW (ref 30.0–100.0)

## 2021-10-30 LAB — HIV ANTIBODY (ROUTINE TESTING W REFLEX): HIV Screen 4th Generation wRfx: NONREACTIVE

## 2021-10-30 LAB — LIPID PANEL
Chol/HDL Ratio: 4.3 ratio (ref 0.0–4.4)
Cholesterol, Total: 184 mg/dL (ref 100–199)
HDL: 43 mg/dL (ref >39)
LDL Chol Calc (NIH): 121 mg/dL — ABNORMAL HIGH (ref 0–99)
Triglycerides: 110 mg/dL (ref 0–149)
VLDL Cholesterol Cal: 20 mg/dL (ref 5–40)

## 2021-10-30 LAB — COMPREHENSIVE METABOLIC PANEL
ALT: 12 IU/L (ref 0–32)
AST: 9 IU/L (ref 0–40)
Albumin/Globulin Ratio: 1.2 (ref 1.2–2.2)
Albumin: 4.1 g/dL (ref 3.9–4.9)
Alkaline Phosphatase: 65 IU/L (ref 44–121)
BUN/Creatinine Ratio: 17 (ref 9–23)
BUN: 16 mg/dL (ref 6–24)
Bilirubin Total: 0.4 mg/dL (ref 0.0–1.2)
CO2: 23 mmol/L (ref 20–29)
Calcium: 8.9 mg/dL (ref 8.7–10.2)
Chloride: 98 mmol/L (ref 96–106)
Creatinine, Ser: 0.92 mg/dL (ref 0.57–1.00)
Globulin, Total: 3.4 g/dL (ref 1.5–4.5)
Glucose: 93 mg/dL (ref 70–99)
Potassium: 4.1 mmol/L (ref 3.5–5.2)
Sodium: 138 mmol/L (ref 134–144)
Total Protein: 7.5 g/dL (ref 6.0–8.5)
eGFR: 80 mL/min/{1.73_m2} (ref >59)

## 2021-10-30 LAB — HEPATITIS C ANTIBODY: Hep C Virus Ab: NONREACTIVE

## 2021-10-30 LAB — CBC
Hematocrit: 34.8 % (ref 34.0–46.6)
Hemoglobin: 11.4 g/dL (ref 11.1–15.9)
MCH: 27.7 pg (ref 26.6–33.0)
MCHC: 32.8 g/dL (ref 31.5–35.7)
MCV: 85 fL (ref 79–97)
Platelets: 188 10*3/uL (ref 150–450)
RBC: 4.12 x10E6/uL (ref 3.77–5.28)
RDW: 13.6 % (ref 11.7–15.4)
WBC: 5.9 10*3/uL (ref 3.4–10.8)

## 2021-10-30 LAB — TSH: TSH: 1.5 u[IU]/mL (ref 0.450–4.500)

## 2021-10-30 MED ORDER — VITAMIN D (ERGOCALCIFEROL) 1.25 MG (50000 UNIT) PO CAPS: 50000.0000 [IU] | ORAL_CAPSULE | ORAL | 0 refills | Status: DC

## 2021-11-03 DIAGNOSIS — R160 Hepatomegaly, not elsewhere classified: Secondary | ICD-10-CM | POA: Diagnosis not present

## 2021-11-03 DIAGNOSIS — Z9109 Other allergy status, other than to drugs and biological substances: Secondary | ICD-10-CM | POA: Diagnosis not present

## 2021-11-03 DIAGNOSIS — K449 Diaphragmatic hernia without obstruction or gangrene: Secondary | ICD-10-CM | POA: Diagnosis not present

## 2021-11-03 DIAGNOSIS — R1013 Epigastric pain: Secondary | ICD-10-CM | POA: Diagnosis not present

## 2021-11-03 DIAGNOSIS — R10813 Right lower quadrant abdominal tenderness: Secondary | ICD-10-CM | POA: Diagnosis not present

## 2021-11-03 DIAGNOSIS — K5730 Diverticulosis of large intestine without perforation or abscess without bleeding: Secondary | ICD-10-CM | POA: Diagnosis not present

## 2021-11-03 DIAGNOSIS — M47817 Spondylosis without myelopathy or radiculopathy, lumbosacral region: Secondary | ICD-10-CM | POA: Diagnosis not present

## 2021-11-03 DIAGNOSIS — R9389 Abnormal findings on diagnostic imaging of other specified body structures: Secondary | ICD-10-CM | POA: Diagnosis not present

## 2021-11-03 DIAGNOSIS — E7800 Pure hypercholesterolemia, unspecified: Secondary | ICD-10-CM | POA: Diagnosis not present

## 2021-11-03 DIAGNOSIS — E559 Vitamin D deficiency, unspecified: Secondary | ICD-10-CM | POA: Diagnosis not present

## 2021-11-03 DIAGNOSIS — N83201 Unspecified ovarian cyst, right side: Secondary | ICD-10-CM | POA: Diagnosis not present

## 2021-11-03 DIAGNOSIS — N83291 Other ovarian cyst, right side: Secondary | ICD-10-CM | POA: Diagnosis not present

## 2021-11-03 DIAGNOSIS — R10816 Epigastric abdominal tenderness: Secondary | ICD-10-CM | POA: Diagnosis not present

## 2021-11-03 DIAGNOSIS — R10811 Right upper quadrant abdominal tenderness: Secondary | ICD-10-CM | POA: Diagnosis not present

## 2021-11-03 DIAGNOSIS — N8500 Endometrial hyperplasia, unspecified: Secondary | ICD-10-CM | POA: Diagnosis not present

## 2021-11-03 DIAGNOSIS — K429 Umbilical hernia without obstruction or gangrene: Secondary | ICD-10-CM | POA: Diagnosis not present

## 2021-11-03 DIAGNOSIS — Z885 Allergy status to narcotic agent status: Secondary | ICD-10-CM | POA: Diagnosis not present

## 2021-11-03 DIAGNOSIS — R1031 Right lower quadrant pain: Secondary | ICD-10-CM | POA: Diagnosis not present

## 2021-11-03 DIAGNOSIS — D251 Intramural leiomyoma of uterus: Secondary | ICD-10-CM | POA: Diagnosis not present

## 2021-11-03 DIAGNOSIS — I10 Essential (primary) hypertension: Secondary | ICD-10-CM | POA: Diagnosis not present

## 2021-11-03 DIAGNOSIS — R1011 Right upper quadrant pain: Secondary | ICD-10-CM | POA: Diagnosis not present

## 2021-11-05 ENCOUNTER — Other Ambulatory Visit: Payer: Self-pay

## 2021-11-05 DIAGNOSIS — N83201 Unspecified ovarian cyst, right side: Secondary | ICD-10-CM

## 2021-11-07 NOTE — Progress Notes (Signed)
GYNECOLOGY ANNUAL PHYSICAL EXAM PROGRESS NOTE  Subjective:    Stacey Hensley is a 41 y.o. female with a history of HTN and morbid obesity who presents for follow up from Emergency Room for right ovarian cyst. She was seen 11/03/2021 at [facility] due to complaints of right sided abdominal pain x 1 day with nausea. Reports being diagnosed with a cyst on her right ovary.  Currently patient reports that she still has some mild tenderness on the right side, but nothing compared to what she experienced last week. Was given prescription for Norco but notes that she cannot take it during the day as it makes her extremely drowsy.  Takes occasionally at night.    Menstrual History: Menarche age: 6 Patient's last menstrual period was 10/15/2021 (exact date).    Gynecologic History:  Contraception: abstinence History of STI's: Denies Last Pap: 06/28/2019. Results were: normal.  Denies h/o abnormal pap smears. Last mammogram: 08/16/2019. Results were: normal   OB History  No obstetric history on file.    Past Medical History:  Diagnosis Date   Allergy    Arthritis    Hypertension 10/01/2021   OSA (obstructive sleep apnea) 10/01/2021    Past Surgical History:  Procedure Laterality Date   FRACTURE SURGERY Right 07/2011   RIght arm    Family History  Problem Relation Age of Onset   Arthritis Mother    COPD Mother    Diabetes Mother    Hypertension Mother    Kidney disease Mother    Miscarriages / [place] Mother    Stroke Mother    Cancer Maternal Aunt     Social History   Socioeconomic History   Marital status: Single    Spouse name: Not on file   Number of children: Not on file   Years of education: Not on file   Highest education level: Not on file  Occupational History   Not on file  Tobacco Use   Smoking status: Never   Smokeless tobacco: Never  Vaping Use   Vaping Use: Never used  Substance and Sexual Activity   Alcohol use: No   Drug use: No   Sexual  activity: Not Currently  Other Topics Concern   Not on file  Social History Narrative   Not on file   Social Determinants of Health   Financial Resource Strain: Not on file  Food Insecurity: No Food Insecurity (10/01/2021)   Hunger Vital Sign    Worried About Running Out of Food in the Last Year: Never true    Ran Out of Food in the Last Year: Never true  Transportation Needs: No Transportation Needs (10/01/2021)   PRAPARE - Administrator, Civil Service (Medical): No    Lack of Transportation (Non-Medical): No  Physical Activity: Not on file  Stress: Not on file  Social Connections: Not on file  Intimate Partner Violence: Not At Risk (10/01/2021)   Humiliation, Afraid, Rape, and Kick questionnaire    Fear of Current or Ex-Partner: No    Emotionally Abused: No    Physically Abused: No    Sexually Abused: No    Current Outpatient Medications on File Prior to Visit  Medication Sig Dispense Refill   HYDROcodone-acetaminophen (NORCO/VICODIN) 5-325 MG tablet Take by mouth.     ibuprofen (ADVIL) 600 MG tablet Take by mouth.     lisinopril-hydrochlorothiazide (ZESTORETIC) 20-25 MG tablet Take 1 tablet by mouth daily. 90 tablet 0   ondansetron (ZOFRAN-ODT) 4 MG disintegrating  tablet Take by mouth.     promethazine-dextromethorphan (PROMETHAZINE-DM) 6.25-15 MG/5ML syrup Take 5 mLs by mouth 4 (four) times daily as needed for cough. 118 mL 0   Vitamin D, Ergocalciferol, (DRISDOL) 1.25 MG (50000 UNIT) CAPS capsule Take 1 capsule (50,000 Units total) by mouth every 7 (seven) days. Take for 8 total doses(weeks) 8 capsule 0   No current facility-administered medications on file prior to visit.    Allergies  Allergen Reactions   Oxycodone-Acetaminophen Other (See Comments)    Hot flashes Hot flashes Patient states this medication keeps her up and she can't sleep.    Percocet [Oxycodone-Acetaminophen] Other (See Comments)    Patient states this medication keeps her up and she  can't sleep.   Tape Other (See Comments)    Patient states this welps her skin.   Tapentadol Other (See Comments)    Patient states this welps her skin.     Review of Systems Review of Systems  All other systems reviewed and are negative.     Objective:  Blood pressure (!) 118/57, pulse 68, height 5\' 8"  (1.727 m), weight (!) 413 lb 9.6 oz (187.6 kg), last menstrual period 10/15/2021.  Body mass index is 62.89 kg/m.  General appearance: alert and no distress Abdomen:  soft, mildly tender on right lower abdomen Pelvic: deferred    Imaging (Pelvic Ultrasound):  EXAM: [place] ENDOVAGINAL (NON-OB) DATE: 11/03/2021 4:28 PM ACCESSION: [id number] UN DICTATED: 11/03/2021 4:40 PM INTERPRETATION LOCATION: [place]  CLINICAL INDICATION: 41 years old Female with rt side ovarian cyst   LMP: 10/15/2021  COMPARISON: CT abdomen pelvis 11/03/2021.  TECHNIQUE: Ultrasound views of the pelvis were obtained endovaginally using gray scale and color Doppler imaging. Spectral Doppler imaging was also performed.  FINDINGS:  UTERUS/CERVIX: The uterus was anteverted and measured 7 x 3.9 x 4.9 cm. 1.7 x 1.8 x 1.6 cm anterior intramural fibroid. 0.8 x 1.3 x 0.7 cm posterior fundal intramural fibroid. The endometrium was normal in thickness, measuring 0.91 cm. There are multiple nabothian cysts. There also appears to be cystic change within the endometrium of the lower uterus/cervix.  OVARIES: The ovaries were seen well transvaginally. . 3.3 cm right ovarian cyst. Appropriate arterial inflow and venous outflow of the ovaries was documented on color and spectral Doppler imaging. The right ovary measured 4.2 x 3 x 3 cm and the left ovary measured 2.7 x 1.7 x 1.4 cm.  OTHER: No abnormal pelvic free fluid. Procedure Note  Pietryga, Melba Coon, MD - 11/03/2021 Formatting of this note might be different from the original. EXAM: [place] ENDOVAGINAL (NON-OB) DATE: 11/03/2021 4:28 PM ACCESSION:  [id number] UN DICTATED: 11/03/2021 4:40 PM INTERPRETATION LOCATION: [place]  CLINICAL INDICATION: 41 years old Female with rt side ovarian cyst   LMP: 10/15/2021  COMPARISON: CT abdomen pelvis 11/03/2021.  TECHNIQUE: Ultrasound views of the pelvis were obtained endovaginally using gray scale and color Doppler imaging. Spectral Doppler imaging was also performed.  FINDINGS:  UTERUS/CERVIX: The uterus was anteverted and measured 7 x 3.9 x 4.9 cm. 1.7 x 1.8 x 1.6 cm anterior intramural fibroid. 0.8 x 1.3 x 0.7 cm posterior fundal intramural fibroid. The endometrium was normal in thickness, measuring 0.91 cm. There are multiple nabothian cysts. There also appears to be cystic change within the endometrium of the lower uterus/cervix.  OVARIES: The ovaries were seen well transvaginally. . 3.3 cm right ovarian cyst. Appropriate arterial inflow and venous outflow of the ovaries was documented on color and spectral Doppler imaging. The  right ovary measured 4.2 x 3 x 3 cm and the left ovary measured 2.7 x 1.7 x 1.4 cm.  OTHER: No abnormal pelvic free fluid.  IMPRESSION:  --3.3 cm right ovarian cyst without sonographic evidence of acute ovarian torsion.  -Fibroid uterus.  -Cystic changes of the endometrium, which can be seen with cystic endometrial hyperplasia. GYN follow-up is recommended.  Spectral doppler was performed on bilateral ovaries. Additional transabdominal images were not obtained. 3D reconstruction images were not obtained.  Please see below for data measurements:   LMP: 10/15/2021  Uterus: Sagittal 7 cm; AP 3.9 cm; Transverse 4.9 cm Endometrium: 0.91 cm Right ovary: Sagittal 4.2 cm; AP 3 cm; Transverse 3 cm Left ovary: Sagittal 2.7 cm; AP 1.7 cm; Transverse 1.4 cm   Assessment:   1. Right ovarian cyst   2. Pelvic pain   3. 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 Breast  cancer screening by mammogram      Plan:  1. Right ovarian cyst - Discussed ovarian cyst management, including expectant management vs hormonal therapy with trial of progesterone-OCPs (due to h/o HTN and age should not use combined OCPs).  Patient opts for expectant management.  - US PELVIC COMPLETE WITH TRANSVAGINAL; Future. To schedule in 4-6 weeks for f/u.   2. Pelvic pain - Discussed higher dosing of Tylenol and NSAIDs to help manage pain during the day (1000 mg of Tylenol q 6 hrs prn and daily Aleve).   3. Class 3 obesity with alveolar hypoventilation, serious comorbidity, and body mass index (BMI) of 60.0 to 69.9 in adult [facility]) - At risk for endogenous estrogen exposure. Patient denies complaints with her cycles at this time. Endometrial stripe normal on recent imaging.   4. Breast cancer screening by mammogram -Review of chart notes patient is overdue for mammogram. Will order.  - MM 3D SCREEN BREAST BILATERAL    Hildred Laser, MD [facility]

## 2021-11-08 ENCOUNTER — Encounter: Payer: Self-pay | Admitting: Obstetrics and Gynecology

## 2021-11-08 ENCOUNTER — Ambulatory Visit: Payer: BC Managed Care – PPO | Admitting: Obstetrics and Gynecology

## 2021-11-08 VITALS — BP 118/57 | HR 68 | Ht 68.0 in | Wt >= 6400 oz

## 2021-11-08 DIAGNOSIS — E662 Morbid (severe) obesity with alveolar hypoventilation: Secondary | ICD-10-CM

## 2021-11-08 DIAGNOSIS — N83201 Unspecified ovarian cyst, right side: Secondary | ICD-10-CM

## 2021-11-08 DIAGNOSIS — Z1231 Encounter for screening mammogram for malignant neoplasm of breast: Secondary | ICD-10-CM

## 2021-11-08 DIAGNOSIS — Z6841 Body Mass Index (BMI) 40.0 and over, adult: Secondary | ICD-10-CM | POA: Diagnosis not present

## 2021-11-08 DIAGNOSIS — R102 Pelvic and perineal pain: Secondary | ICD-10-CM

## 2021-11-08 NOTE — Progress Notes (Incomplete)
GYNECOLOGY ANNUAL PHYSICAL EXAM PROGRESS NOTE  Subjective:    Stacey Hensley is a 41 y.o. female with a history of HTN and morbid obesity who presents for follow up from Emergency Room for right ovarian cyst. She was seen 11/03/2021 at [facility] due to complaints of right sided abdominal pain x 1 day with nausea. Reports being diagnosed with a cyst on her right ovary.  Currently patient reports that she still has some mild tenderness on the right side, but nothing compared to what she experienced last week. Was given prescription for Norco but notes that she cannot take it during the day as it makes her extremely drowsy.  Takes occasionally at night.    Menstrual History: Menarche age: 4 Patient's last menstrual period was 10/15/2021 (exact date).    Gynecologic History:  Contraception: abstinence History of STI's: Denies Last Pap: 06/28/2019. Results were: normal.  Denies h/o abnormal pap smears. Last mammogram: 08/16/2019. Results were: normal   OB History  No obstetric history on file.    Past Medical History:  Diagnosis Date  . Allergy   . Arthritis   . Hypertension 10/01/2021  . OSA (obstructive sleep apnea) 10/01/2021    Past Surgical History:  Procedure Laterality Date  . FRACTURE SURGERY Right 07/2011   RIght arm    Family History  Problem Relation Age of Onset  . Arthritis Mother   . COPD Mother   . Diabetes Mother   . Hypertension Mother   . Kidney disease Mother   . Miscarriages / [place] Mother   . Stroke Mother   . Cancer Maternal Aunt     Social History   Socioeconomic History  . Marital status: Single    Spouse name: Not on file  . Number of children: Not on file  . Years of education: Not on file  . Highest education level: Not on file  Occupational History  . Not on file  Tobacco Use  . Smoking status: Never  . Smokeless tobacco: Never  Vaping Use  . Vaping Use: Never used  Substance and Sexual Activity  . Alcohol use: No  .  Drug use: No  . Sexual activity: Not Currently  Other Topics Concern  . Not on file  Social History Narrative  . Not on file   Social Determinants of Health   Financial Resource Strain: Not on file  Food Insecurity: No Food Insecurity (10/01/2021)   Hunger Vital Sign   . Worried About Charity fundraiser in the Last Year: Never true   . Ran Out of Food in the Last Year: Never true  Transportation Needs: No Transportation Needs (10/01/2021)   PRAPARE - Transportation   . Lack of Transportation (Medical): No   . Lack of Transportation (Non-Medical): No  Physical Activity: Not on file  Stress: Not on file  Social Connections: Not on file  Intimate Partner Violence: Not At Risk (10/01/2021)   Humiliation, Afraid, Rape, and Kick questionnaire   . Fear of Current or Ex-Partner: No   . Emotionally Abused: No   . Physically Abused: No   . Sexually Abused: No    Current Outpatient Medications on File Prior to Visit  Medication Sig Dispense Refill  . HYDROcodone-acetaminophen (NORCO/VICODIN) 5-325 MG tablet Take by mouth.    Marland Kitchen ibuprofen (ADVIL) 600 MG tablet Take by mouth.    Marland Kitchen lisinopril-hydrochlorothiazide (ZESTORETIC) 20-25 MG tablet Take 1 tablet by mouth daily. 90 tablet 0  . ondansetron (ZOFRAN-ODT) 4 MG disintegrating  tablet Take by mouth.    . promethazine-dextromethorphan (PROMETHAZINE-DM) 6.25-15 MG/5ML syrup Take 5 mLs by mouth 4 (four) times daily as needed for cough. 118 mL 0  . Vitamin D, Ergocalciferol, (DRISDOL) 1.25 MG (50000 UNIT) CAPS capsule Take 1 capsule (50,000 Units total) by mouth every 7 (seven) days. Take for 8 total doses(weeks) 8 capsule 0   No current facility-administered medications on file prior to visit.    Allergies  Allergen Reactions  . Oxycodone-Acetaminophen Other (See Comments)    Hot flashes Hot flashes Patient states this medication keeps her up and she can't sleep.   Marland Kitchen Percocet [Oxycodone-Acetaminophen] Other (See Comments)    Patient  states this medication keeps her up and she can't sleep.  . Tape Other (See Comments)    Patient states this welps her skin.  . Tapentadol Other (See Comments)    Patient states this welps her skin.     Review of Systems Review of Systems  All other systems reviewed and are negative.     Objective:  Blood pressure (!) 118/57, pulse 68, height 5\' 8"  (1.727 m), weight (!) 413 lb 9.6 oz (187.6 kg), last menstrual period 10/15/2021.  Body mass index is 62.89 kg/m.      Impression   --3.3 cm right ovarian cyst without sonographic evidence of acute ovarian torsion.  -Fibroid uterus.  -Cystic changes of the endometrium, which can be seen with cystic endometrial hyperplasia. GYN follow-up is recommended.  Spectral doppler was performed on bilateral ovaries. Additional transabdominal images were not obtained. 3D reconstruction images were not obtained.  Please see below for data measurements:   LMP: 10/15/2021  Uterus: Sagittal 7 cm; AP 3.9 cm; Transverse 4.9 cm Endometrium: 0.91 cm Right ovary: Sagittal 4.2 cm; AP 3 cm; Transverse 3 cm Left ovary: Sagittal 2.7 cm; AP 1.7 cm; Transverse 1.4 cm Narrative  EXAM: [place] ENDOVAGINAL (NON-OB) DATE: 11/03/2021 4:28 PM ACCESSION: [id number] UN DICTATED: 11/03/2021 4:40 PM INTERPRETATION LOCATION: [place]  CLINICAL INDICATION: 41 years old Female with rt side ovarian cyst   LMP: 10/15/2021  COMPARISON: CT abdomen pelvis 11/03/2021.  TECHNIQUE: Ultrasound views of the pelvis were obtained endovaginally using gray scale and color Doppler imaging. Spectral Doppler imaging was also performed.  FINDINGS:  UTERUS/CERVIX: The uterus was anteverted and measured 7 x 3.9 x 4.9 cm. 1.7 x 1.8 x 1.6 cm anterior intramural fibroid. 0.8 x 1.3 x 0.7 cm posterior fundal intramural fibroid. The endometrium was normal in thickness, measuring 0.91 cm. There are multiple nabothian cysts. There also appears to be cystic change within the  endometrium of the lower uterus/cervix.  OVARIES: The ovaries were seen well transvaginally. . 3.3 cm right ovarian cyst. Appropriate arterial inflow and venous outflow of the ovaries was documented on color and spectral Doppler imaging. The right ovary measured 4.2 x 3 x 3 cm and the left ovary measured 2.7 x 1.7 x 1.4 cm.  OTHER: No abnormal pelvic free fluid. Procedure Note  Pietryga, Melba Coon, MD - 11/03/2021 Formatting of this note might be different from the original. EXAM: [place] ENDOVAGINAL (NON-OB) DATE: 11/03/2021 4:28 PM ACCESSION: [id number] UN DICTATED: 11/03/2021 4:40 PM INTERPRETATION LOCATION: [place]  CLINICAL INDICATION: 41 years old Female with rt side ovarian cyst   LMP: 10/15/2021  COMPARISON: CT abdomen pelvis 11/03/2021.  TECHNIQUE: Ultrasound views of the pelvis were obtained endovaginally using gray scale and color Doppler imaging. Spectral Doppler imaging was also performed.  FINDINGS:  UTERUS/CERVIX: The uterus was anteverted and measured  7 x 3.9 x 4.9 cm. 1.7 x 1.8 x 1.6 cm anterior intramural fibroid. 0.8 x 1.3 x 0.7 cm posterior fundal intramural fibroid. The endometrium was normal in thickness, measuring 0.91 cm. There are multiple nabothian cysts. There also appears to be cystic change within the endometrium of the lower uterus/cervix.  OVARIES: The ovaries were seen well transvaginally. . 3.3 cm right ovarian cyst. Appropriate arterial inflow and venous outflow of the ovaries was documented on color and spectral Doppler imaging. The right ovary measured 4.2 x 3 x 3 cm and the left ovary measured 2.7 x 1.7 x 1.4 cm.  OTHER: No abnormal pelvic free fluid.  IMPRESSION:  --3.3 cm right ovarian cyst without sonographic evidence of acute ovarian torsion.  -Fibroid uterus.  -Cystic changes of the endometrium, which can be seen with cystic endometrial hyperplasia. GYN follow-up is recommended.  Spectral doppler was performed on bilateral  ovaries. Additional transabdominal images were not obtained. 3D reconstruction images were not obtained.  Please see below for data measurements:   LMP: 10/15/2021  Uterus: Sagittal 7 cm; AP 3.9 cm; Transverse 4.9 cm Endometrium: 0.91 cm Right ovary: Sagittal 4.2 cm; AP 3 cm; Transverse 3 cm Left ovary: Sagittal 2.7 cm; AP 1.7 cm; Transverse 1.4 cm     General Appearance:    Alert, cooperative, no distress, appears stated age  Head:    Normocephalic, without obvious abnormality, atraumatic  Eyes:    PERRL, conjunctiva/corneas clear, EOM's intact, both eyes  Ears:    Normal external ear canals, both ears  Nose:   Nares normal, septum midline, mucosa normal, no drainage or sinus tenderness  Throat:   Lips, mucosa, and tongue normal; teeth and gums normal  Neck:   Supple, symmetrical, trachea midline, no adenopathy; thyroid: no enlargement/tenderness/nodules; no carotid bruit or JVD  Back:     Symmetric, no curvature, ROM normal, no CVA tenderness  Lungs:     Clear to auscultation bilaterally, respirations unlabored  Chest Wall:    No tenderness or deformity   Heart:    Regular rate and rhythm, S1 and S2 normal, no murmur, rub or gallop  Breast Exam:    No tenderness, masses, or nipple abnormality  Abdomen:     Soft, non-tender, bowel sounds active all four quadrants, no masses, no organomegaly.    Genitalia:    Pelvic:external genitalia normal, vagina without lesions, discharge, or tenderness, rectovaginal septum  normal. Cervix normal in appearance, no cervical motion tenderness, no adnexal masses or tenderness.  Uterus normal size, shape, mobile, regular contours, nontender.  Rectal:    Normal external sphincter.  No hemorrhoids appreciated. Internal exam not done.   Extremities:   Extremities normal, atraumatic, no cyanosis or edema  Pulses:   2+ and symmetric all extremities  Skin:   Skin color, texture, turgor normal, no rashes or lesions  Lymph nodes:   Cervical, supraclavicular,  and axillary nodes normal  Neurologic:   CNII-XII intact, normal strength, sensation and reflexes throughout   .  Labs:  Lab Results  Component Value Date   WBC 5.9 10/29/2021   HGB 11.4 10/29/2021   HCT 34.8 10/29/2021   MCV 85 10/29/2021   PLT 188 10/29/2021    Lab Results  Component Value Date   CREATININE 0.92 10/29/2021   BUN 16 10/29/2021   NA 138 10/29/2021   K 4.1 10/29/2021   CL 98 10/29/2021   CO2 23 10/29/2021    Lab Results  Component Value Date   ALT  12 10/29/2021   AST 9 10/29/2021   ALKPHOS 65 10/29/2021   BILITOT 0.4 10/29/2021    Lab Results  Component Value Date   TSH 1.500 10/29/2021     Assessment:   No diagnosis found.   Plan:  Blood tests: {blood tests:13147}. Breast self exam technique reviewed and patient encouraged to perform self-exam monthly. Contraception: abstinence. Discussed healthy lifestyle modifications. Mammogram ordered Pap smear  UTD . COVID vaccination status: Flu Vaccine: UTD Follow up in 1 year for annual exam   Rubie Maid, MD [place]

## 2021-12-06 ENCOUNTER — Encounter: Payer: Self-pay | Admitting: Family Medicine

## 2021-12-06 NOTE — Telephone Encounter (Signed)
Appointment

## 2021-12-06 NOTE — Telephone Encounter (Signed)
Please advise 

## 2021-12-17 ENCOUNTER — Other Ambulatory Visit: Payer: BC Managed Care – PPO

## 2021-12-17 ENCOUNTER — Ambulatory Visit: Payer: BC Managed Care – PPO

## 2022-01-08 ENCOUNTER — Other Ambulatory Visit: Payer: BC Managed Care – PPO

## 2022-01-18 ENCOUNTER — Ambulatory Visit
Admission: RE | Admit: 2022-01-18 | Discharge: 2022-01-18 | Disposition: A | Discharge status: Home / Self Care | Payer: BC Managed Care – PPO | Source: Ambulatory Visit | Attending: Obstetrics and Gynecology | Admitting: Obstetrics and Gynecology

## 2022-01-18 DIAGNOSIS — Z1231 Encounter for screening mammogram for malignant neoplasm of breast: Secondary | ICD-10-CM | POA: Insufficient documentation

## 2022-01-21 ENCOUNTER — Ambulatory Visit: Payer: BC Managed Care – PPO | Admitting: Family Medicine

## 2022-01-21 ENCOUNTER — Encounter: Payer: Self-pay | Admitting: Family Medicine

## 2022-01-21 VITALS — BP 128/82 | HR 78 | Ht 68.0 in | Wt >= 6400 oz

## 2022-01-21 DIAGNOSIS — I10 Essential (primary) hypertension: Secondary | ICD-10-CM

## 2022-01-21 DIAGNOSIS — J309 Allergic rhinitis, unspecified: Secondary | ICD-10-CM

## 2022-01-21 MED ORDER — LISINOPRIL-HYDROCHLOROTHIAZIDE 20-25 MG PO TABS: 1.0000 | ORAL_TABLET | Freq: Every day | ORAL | 1 refills | Status: DC

## 2022-01-21 MED ORDER — PROMETHAZINE-DM 6.25-15 MG/5ML PO SYRP: 5.0000 mL | ORAL_SOLUTION | Freq: Four times a day (QID) | ORAL | 0 refills | Status: DC | PRN

## 2022-01-21 MED ORDER — MOMETASONE FUROATE 50 MCG/ACT NA SUSP: NASAL | 6 refills | Status: DC

## 2022-01-21 NOTE — Assessment & Plan Note (Addendum)
Acute on chronic congestion, nonproductive cough, afebrile x 2 months.  She denies any shortness of air, body aches, has been utilizing Flonase and Claritin.  Symptoms are worst towards the evening and overnight, denies any new exposures.  Examination is reassuring from a vital standpoint, mildly swollen, not erythematous turbinates, oropharynx, tympanic membranes, canals benign, no lymphadenopathy, clear lung fields throughout with benign cardiac sounds.  Will advance Flonase to mometasone scheduled, is to continue Claritin, referral to allergy placed as well.

## 2022-01-21 NOTE — Assessment & Plan Note (Addendum)
Chronic, demonstrating adequate control.  She denies any cardiopulmonary complaints and examination is consistent with the same.  Will continue on current lisinopril-HCTZ regimen.  Return for follow-up in 6 months.

## 2022-01-21 NOTE — Patient Instructions (Addendum)
-  Start new nasal spray mometasone (Nasonex), 1 spray twice daily - Continue Claritin daily - Continue the above regimen until you see allergist (referral coordinator will contact you to schedule) - Continue current blood pressure medication - Review information attached for allergies and high blood pressure - Return for follow-up in 6 months

## 2022-01-21 NOTE — Progress Notes (Signed)
     Primary Care / Sports Medicine Office Visit  Patient Information:  Patient ID: Stacey Hensley, female DOB: 04-16-1980 Age: 42 y.o. MRN: [id number]   Stacey Hensley is a pleasant 42 y.o. female presenting with the following:  No chief complaint on file.   Vitals:   01/21/22 1046  BP: 128/82  Pulse: 78  SpO2: 98%   Vitals:   01/21/22 1046  Weight: (!) 416 lb (188.7 kg)  Height: 5\' 8"  (1.727 m)   Body mass index is 63.25 kg/m.  No results found.   Independent interpretation of notes and tests performed by another provider:   None  Procedures performed:   None  Pertinent History, Exam, Impression, and Recommendations:   Allergic rhinitis, unspecified seasonality, unspecified trigger Assessment & Plan: Acute on chronic congestion, nonproductive cough, afebrile x 2 months.  She denies any shortness of air, body aches, has been utilizing Flonase and Claritin.  Symptoms are worst towards the evening and overnight, denies any new exposures.  Examination is reassuring from a vital standpoint, mildly swollen, not erythematous turbinates, oropharynx, tympanic membranes, canals benign, no lymphadenopathy, clear lung fields throughout with benign cardiac sounds.  Will advance Flonase to mometasone scheduled, is to continue Claritin, referral to allergy placed as well.  Orders: -     Promethazine-DM; Take 5 mLs by mouth 4 (four) times daily as needed for cough.  Dispense: 118 mL; Refill: 0 -     Ambulatory referral to Allergy -     Mometasone Furoate; One spray in each nostril twice a day, use left hand for right nostril, and right hand for left nostril.  Please dispense one bottle.  Dispense: 1 g; Refill: 6  Hypertension, unspecified type Assessment & Plan: Chronic, demonstrating adequate control.  She denies any cardiopulmonary complaints and examination is consistent with the same.  Will continue on current lisinopril-HCTZ regimen.  Return for follow-up in 6  months.  Orders: -     Lisinopril-hydroCHLOROthiazide; Take 1 tablet by mouth daily.  Dispense: 90 tablet; Refill: 1     Orders & Medications Meds ordered this encounter  Medications   lisinopril-hydrochlorothiazide (ZESTORETIC) 20-25 MG tablet    Sig: Take 1 tablet by mouth daily.    Dispense:  90 tablet    Refill:  1   promethazine-dextromethorphan (PROMETHAZINE-DM) 6.25-15 MG/5ML syrup    Sig: Take 5 mLs by mouth 4 (four) times daily as needed for cough.    Dispense:  118 mL    Refill:  0   mometasone (NASONEX) 50 MCG/ACT nasal spray    Sig: One spray in each nostril twice a day, use left hand for right nostril, and right hand for left nostril.  Please dispense one bottle.    Dispense:  1 g    Refill:  6   Orders Placed This Encounter  Procedures   Ambulatory referral to Allergy     No follow-ups on file.     Montel Culver, MD, [facility]   Primary Care Sports Medicine Primary Care and Sports Medicine at [facility]

## 2022-01-23 ENCOUNTER — Other Ambulatory Visit: Payer: Self-pay | Admitting: *Deleted

## 2022-01-23 ENCOUNTER — Inpatient Hospital Stay
Admission: RE | Admit: 2022-01-23 | Discharge: 2022-01-23 | Disposition: A | Discharge status: Home / Self Care | Payer: Self-pay | Source: Ambulatory Visit | Attending: *Deleted | Admitting: *Deleted

## 2022-01-23 DIAGNOSIS — Z1231 Encounter for screening mammogram for malignant neoplasm of breast: Secondary | ICD-10-CM

## 2022-01-28 ENCOUNTER — Ambulatory Visit: Payer: BC Managed Care – PPO | Admitting: Family Medicine

## 2022-02-04 ENCOUNTER — Other Ambulatory Visit: Payer: BC Managed Care – PPO

## 2022-02-05 ENCOUNTER — Ambulatory Visit: Payer: BC Managed Care – PPO

## 2022-05-14 DIAGNOSIS — R110 Nausea: Secondary | ICD-10-CM | POA: Diagnosis not present

## 2022-05-14 DIAGNOSIS — Z1152 Encounter for screening for COVID-19: Secondary | ICD-10-CM | POA: Diagnosis not present

## 2022-05-14 DIAGNOSIS — E7800 Pure hypercholesterolemia, unspecified: Secondary | ICD-10-CM | POA: Diagnosis not present

## 2022-05-14 DIAGNOSIS — R9431 Abnormal electrocardiogram [ECG] [EKG]: Secondary | ICD-10-CM | POA: Diagnosis not present

## 2022-05-14 DIAGNOSIS — R0789 Other chest pain: Secondary | ICD-10-CM | POA: Diagnosis not present

## 2022-05-14 DIAGNOSIS — R109 Unspecified abdominal pain: Secondary | ICD-10-CM | POA: Diagnosis not present

## 2022-05-14 DIAGNOSIS — I10 Essential (primary) hypertension: Secondary | ICD-10-CM | POA: Diagnosis not present

## 2022-05-14 DIAGNOSIS — R079 Chest pain, unspecified: Secondary | ICD-10-CM | POA: Diagnosis not present

## 2022-05-14 DIAGNOSIS — R1011 Right upper quadrant pain: Secondary | ICD-10-CM | POA: Diagnosis not present

## 2022-05-15 DIAGNOSIS — R109 Unspecified abdominal pain: Secondary | ICD-10-CM | POA: Diagnosis not present

## 2022-07-04 ENCOUNTER — Other Ambulatory Visit: Payer: Self-pay | Admitting: Family Medicine

## 2022-07-04 DIAGNOSIS — I10 Essential (primary) hypertension: Secondary | ICD-10-CM

## 2022-07-05 NOTE — Telephone Encounter (Signed)
Requested Prescriptions  Pending Prescriptions Disp Refills   lisinopril-hydrochlorothiazide (ZESTORETIC) 10-12.5 MG tablet [Pharmacy Med Name: LISINOPRIL-HCTZ 10/12.5MG  TABLETS] 30 tablet 0    Sig: TAKE 1 TABLET BY MOUTH DAILY     Cardiovascular:  ACEI + Diuretic Combos Failed - 07/04/2022  4:38 PM      Failed - Na in normal range and within 180 days    Sodium  Date Value Ref Range Status  10/29/2021 138 134 - 144 mmol/L Final  10/30/2012 139 136 - 145 mmol/L Final         Failed - K in normal range and within 180 days    Potassium  Date Value Ref Range Status  10/29/2021 4.1 3.5 - 5.2 mmol/L Final  10/30/2012 3.8 3.5 - 5.1 mmol/L Final         Failed - Cr in normal range and within 180 days    Creatinine  Date Value Ref Range Status  10/30/2012 1.06 0.60 - 1.30 mg/dL Final   Creatinine, Ser  Date Value Ref Range Status  10/29/2021 0.92 0.57 - 1.00 mg/dL Final         Failed - eGFR is 30 or above and within 180 days    EGFR (African American)  Date Value Ref Range Status  10/30/2012 >60  Final   GFR calc Af Amer  Date Value Ref Range Status  12/31/2014 >60 >60 mL/min Final    Comment:    (NOTE) The eGFR has been calculated using the CKD EPI equation. This calculation has not been validated in all clinical situations. eGFR's persistently <60 mL/min signify possible Chronic Kidney Disease.    EGFR (Non-African Amer.)  Date Value Ref Range Status  10/30/2012 >60  Final    Comment:    eGFR values <3mL/min/1.73 m2 may be an indication of chronic kidney disease (CKD). Calculated eGFR is useful in patients with stable renal function. The eGFR calculation will not be reliable in acutely ill patients when serum creatinine is changing rapidly. It is not useful in  patients on dialysis. The eGFR calculation may not be applicable to patients at the low and high extremes of body sizes, pregnant women, and vegetarians.    GFR calc non Af Amer  Date Value Ref Range  Status  12/31/2014 >60 >60 mL/min Final   eGFR  Date Value Ref Range Status  10/29/2021 80 >59 mL/min/1.73 Final         Passed - Patient is not pregnant      Passed - Last BP in normal range    BP Readings from Last 1 Encounters:  01/21/22 128/82         Passed - Valid encounter within last 6 months    Recent Outpatient Visits           5 months ago Allergic rhinitis, unspecified seasonality, unspecified trigger   [place] [facility], Ocie Bob, MD   8 months ago Hypertension, unspecified type   [facility] Primary Care [facility] Medicine at [facility], Ocie Bob, MD   9 months ago Hypertension, unspecified type   [facility] Care [facility] Medicine at [facility], Ocie Bob, MD       Future Appointments             In 2 weeks Ashley Royalty, Ocie Bob, MD [facility] Primary Care & Sports Medicine at [facility], [facility]

## 2022-07-22 ENCOUNTER — Ambulatory Visit: Payer: BC Managed Care – PPO | Admitting: Family Medicine

## 2022-07-22 ENCOUNTER — Encounter: Payer: Self-pay | Admitting: Family Medicine

## 2022-07-22 VITALS — BP 148/102 | HR 68 | Ht 68.0 in | Wt >= 6400 oz

## 2022-07-22 DIAGNOSIS — R1084 Generalized abdominal pain: Secondary | ICD-10-CM | POA: Insufficient documentation

## 2022-07-22 DIAGNOSIS — M5442 Lumbago with sciatica, left side: Secondary | ICD-10-CM

## 2022-07-22 DIAGNOSIS — K2100 Gastro-esophageal reflux disease with esophagitis, without bleeding: Secondary | ICD-10-CM | POA: Insufficient documentation

## 2022-07-22 DIAGNOSIS — G8929 Other chronic pain: Secondary | ICD-10-CM | POA: Insufficient documentation

## 2022-07-22 DIAGNOSIS — F418 Other specified anxiety disorders: Secondary | ICD-10-CM

## 2022-07-22 DIAGNOSIS — M5441 Lumbago with sciatica, right side: Secondary | ICD-10-CM

## 2022-07-22 DIAGNOSIS — J029 Acute pharyngitis, unspecified: Secondary | ICD-10-CM | POA: Diagnosis not present

## 2022-07-22 DIAGNOSIS — I10 Essential (primary) hypertension: Secondary | ICD-10-CM

## 2022-07-22 HISTORY — DX: Other specified anxiety disorders: F41.8

## 2022-07-22 MED ORDER — AUVELITY 45-105 MG PO TBCR
1.0000 | EXTENDED_RELEASE_TABLET | Freq: Two times a day (BID) | ORAL | 2 refills | Status: DC
Start: 2022-07-22 — End: 2022-07-22

## 2022-07-22 MED ORDER — AZITHROMYCIN 250 MG PO TABS
ORAL_TABLET | ORAL | 0 refills | Status: AC
Start: 2022-07-22 — End: 2022-07-27

## 2022-07-22 MED ORDER — GABAPENTIN 300 MG PO CAPS
300.0000 mg | ORAL_CAPSULE | Freq: Every day | ORAL | 0 refills | Status: DC
Start: 2022-07-22 — End: 2022-08-19

## 2022-07-22 MED ORDER — AUVELITY 45-105 MG PO TBCR
1.0000 | EXTENDED_RELEASE_TABLET | Freq: Two times a day (BID) | ORAL | 2 refills | Status: DC
Start: 2022-07-22 — End: 2022-10-30

## 2022-07-22 MED ORDER — PROMETHAZINE-DM 6.25-15 MG/5ML PO SYRP
5.0000 mL | ORAL_SOLUTION | Freq: Four times a day (QID) | ORAL | 0 refills | Status: DC | PRN
Start: 2022-07-22 — End: 2022-08-19

## 2022-07-22 MED ORDER — PANTOPRAZOLE SODIUM 40 MG PO TBEC
40.0000 mg | DELAYED_RELEASE_TABLET | Freq: Every day | ORAL | 3 refills | Status: DC
Start: 2022-07-22 — End: 2022-09-17

## 2022-07-22 MED ORDER — DICYCLOMINE HCL 20 MG PO TABS
20.0000 mg | ORAL_TABLET | Freq: Four times a day (QID) | ORAL | 2 refills | Status: DC | PRN
Start: 2022-07-22 — End: 2022-08-19

## 2022-07-22 MED ORDER — LISINOPRIL-HYDROCHLOROTHIAZIDE 20-25 MG PO TABS
1.0000 | ORAL_TABLET | Freq: Every day | ORAL | 2 refills | Status: DC
Start: 2022-07-22 — End: 2022-08-19

## 2022-07-22 NOTE — Patient Instructions (Addendum)
For throat/cough: - Dose azithromycin (antibiotic) for full course - Dose promethazine-DM for cough as needed  For stomach pain: - Start pantoprazole every morning on empty stomach at least 30 minutes before food, take this daily - Can use Bentyl (dicyclomine) as needed for stomach cramps/pain  For mood: - Start Auvelity, 1 tablet daily/nightly x 3 days then increase to 1 tablet twice a day - Referral coordinator will contact you to schedule psychology/therapy  For back pain: - Start nightly gabapentin 300 mg, side effect and be drowsiness  Return for follow-up in 1 month, contact for questions/concerns between now and then

## 2022-07-22 NOTE — Assessment & Plan Note (Signed)
Chronic, has resulted in frequent NSAID usage, in turn resulting in GI disturbance.  We reviewed various treatment options and patient will proceed as follows: - Limit NSAID usage - Start nightly gabapentin - Once symptoms allow formal versus home-based PT

## 2022-07-22 NOTE — Assessment & Plan Note (Signed)
Chronic, without adequate control, in the setting of comorbid significantly increased life stressors (see additional assessment(s) for plan details).  Reports compliance with lisinopril-HCTZ.  Denies any cardiopulmonary complaints.  Exam nonfocal in that regard.  Plan as follows: - Will work on comorbid stress related issues - Continue current lisinopril-HCTZ - Close follow-up in 1 month with low threshold to titrate as clinically guided

## 2022-07-22 NOTE — Assessment & Plan Note (Signed)
Cough, pharyngeal pain, congestion for roughly 1 month, somewhat relapsing/remitting course, has been significantly symptomatic for the past 1 week.  Exam with mild pharyngeal erythema, no exudates, otherwise nasopharynx, tympanic membranes/canals are benign, no lymphadenopathy.  Differential can include uncontrolled allergic rhinitis with superimposed infection versus comorbid reflux creating pharyngeal symptoms.  Plan as follows: - Start azithromycin course - Start pantoprazole to treat comorbid GERD, see additional assessment(s) for plan details. - Use OTC antihistamines as needed - We will reevaluate this at return, can modify pharmacotherapy accordingly if still symptomatic without improvement

## 2022-07-22 NOTE — Assessment & Plan Note (Signed)
Chronic issue with limited control, in the setting of comorbid GERD.  -Can transition to as needed dosing of Bentyl - Placed on pantoprazole, will assess response at return - Recalcitrant symptoms can be addressed by coordination of GI referral

## 2022-07-22 NOTE — Assessment & Plan Note (Signed)
Acute on chronic, brings up colicky longstanding abdominal pain previously prescribed Bentyl for with limited response, describes pharyngeal component.  We discussed GERD and appropriate treatments.  Plan as follows: - Start pantoprazole daily on empty stomach - Lifestyle modifications encouraged, information provided - Close follow-up in 1 month for reevaluation, recalcitrant symptoms can be further addressed by gastroenterology referral

## 2022-07-22 NOTE — Assessment & Plan Note (Signed)
Patient presents with acute on chronic progressive worsening depression anxiety in the setting of multiple significant life stressors (familial health issues, etc.).  She states that these are interfering with her ADLs and overall health.  We reviewed both pharmacologic and nonpharmacologic treatment strategies, plan as follows: - Initiate Auvelity daily x 3 days then transition to twice daily dosing - Referral to psychology for cognitive behavioral therapy - Close follow-up in 1 month for reevaluation

## 2022-07-22 NOTE — Progress Notes (Signed)
Primary Care / Sports Medicine Office Visit  Patient Information:  Patient ID: Stacey Hensley, female DOB: January 26, 1980 Age: 42 y.o. MRN: [id number]   Stacey Hensley is a pleasant 43 y.o. female presenting with the following:  Chief Complaint  Patient presents with   Cough    Coughing until vomit, sore throat, nasal congestion for 1 months. Negative covid.throws up making the throat hurt   Hypertension    Hasd a lot going on,  taking BP meds at night     Vitals:   07/22/22 1034 07/22/22 1039  BP: (!) 150/100 (!) 148/102  Pulse: 68   SpO2: 98%    Vitals:   07/22/22 1034  Weight: (!) 413 lb (187.3 kg)  Height: 5\' 8"  (1.727 m)   Body mass index is 62.8 kg/m.  No results found.   Independent interpretation of notes and tests performed by another provider:   None  Procedures performed:   None  Pertinent History, Exam, Impression, and Recommendations:   Stacey Hensley was seen today for cough and hypertension.  Depression with anxiety Overview: Previous treatments: - Sertraline  Assessment & Plan: Patient presents with acute on chronic progressive worsening depression anxiety in the setting of multiple significant life stressors (familial health issues, etc.).  She states that these are interfering with her ADLs and overall health.  We reviewed both pharmacologic and nonpharmacologic treatment strategies, plan as follows: - Initiate Auvelity daily x 3 days then transition to twice daily dosing - Referral to psychology for cognitive behavioral therapy - Close follow-up in 1 month for reevaluation  Orders: -     Auvelity; Take 1 tablet by mouth in the morning and at bedtime.  Dispense: 180 tablet; Refill: 2  Gastroesophageal reflux disease with esophagitis, unspecified whether hemorrhage Assessment & Plan: Acute on chronic, brings up colicky longstanding abdominal pain previously prescribed Bentyl for with limited response, describes pharyngeal component.  We  discussed GERD and appropriate treatments.  Plan as follows: - Start pantoprazole daily on empty stomach - Lifestyle modifications encouraged, information provided - Close follow-up in 1 month for reevaluation, recalcitrant symptoms can be further addressed by gastroenterology referral  Orders: -     Pantoprazole Sodium; Take 1 tablet (40 mg total) by mouth daily. Take on empty stomach at least 30 minutes prior to food.  Dispense: 90 tablet; Refill: 3  Pharyngitis, unspecified etiology Assessment & Plan: Cough, pharyngeal pain, congestion for roughly 1 month, somewhat relapsing/remitting course, has been significantly symptomatic for the past 1 week.  Exam with mild pharyngeal erythema, no exudates, otherwise nasopharynx, tympanic membranes/canals are benign, no lymphadenopathy.  Differential can include uncontrolled allergic rhinitis with superimposed infection versus comorbid reflux creating pharyngeal symptoms.  Plan as follows: - Start azithromycin course - Start pantoprazole to treat comorbid GERD, see additional assessment(s) for plan details. - Use OTC antihistamines as needed - We will reevaluate this at return, can modify pharmacotherapy accordingly if still symptomatic without improvement  Orders: -     Azithromycin; Take 2 tablets on day 1, then 1 tablet daily on days 2 through 5  Dispense: 6 tablet; Refill: 0 -     Promethazine-DM; Take 5 mLs by mouth 4 (four) times daily as needed for cough.  Dispense: 118 mL; Refill: 0  Chronic bilateral low back pain with bilateral sciatica Assessment & Plan: Chronic, has resulted in frequent NSAID usage, in turn resulting in GI disturbance.  We reviewed various treatment options and patient will proceed as follows: -  Limit NSAID usage - Start nightly gabapentin - Once symptoms allow formal versus home-based PT  Orders: -     Gabapentin; Take 1 capsule (300 mg total) by mouth at bedtime.  Dispense: 30 capsule; Refill: 0  Colicky  abdominal pain Assessment & Plan: Chronic issue with limited control, in the setting of comorbid GERD.  -Can transition to as needed dosing of Bentyl - Placed on pantoprazole, will assess response at return - Recalcitrant symptoms can be addressed by coordination of GI referral  Orders: -     Dicyclomine HCl; Take 1 tablet (20 mg total) by mouth every 6 (six) hours as needed for spasms.  Dispense: 90 tablet; Refill: 2  Hypertension, unspecified type Assessment & Plan: Chronic, without adequate control, in the setting of comorbid significantly increased life stressors (see additional assessment(s) for plan details).  Reports compliance with lisinopril-HCTZ.  Denies any cardiopulmonary complaints.  Exam nonfocal in that regard.  Plan as follows: - Will work on comorbid stress related issues - Continue current lisinopril-HCTZ - Close follow-up in 1 month with low threshold to titrate as clinically guided  Orders: -     Lisinopril-hydroCHLOROthiazide; Take 1 tablet by mouth daily.  Dispense: 30 tablet; Refill: 2   I provided a total time of 52 minutes including both face-to-face and non-face-to-face time on 07/22/2022 inclusive of time utilized for medical chart review, information gathering, care coordination with staff, and documentation completion.   Orders & Medications Meds ordered this encounter  Medications   DISCONTD: Dextromethorphan-buPROPion ER (AUVELITY) 45-105 MG TBCR    Sig: Take 1 tablet by mouth in the morning and at bedtime.    Dispense:  180 tablet    Refill:  2   pantoprazole (PROTONIX) 40 MG tablet    Sig: Take 1 tablet (40 mg total) by mouth daily. Take on empty stomach at least 30 minutes prior to food.    Dispense:  90 tablet    Refill:  3   dicyclomine (BENTYL) 20 MG tablet    Sig: Take 1 tablet (20 mg total) by mouth every 6 (six) hours as needed for spasms.    Dispense:  90 tablet    Refill:  2   azithromycin (ZITHROMAX) 250 MG tablet    Sig: Take 2  tablets on day 1, then 1 tablet daily on days 2 through 5    Dispense:  6 tablet    Refill:  0   gabapentin (NEURONTIN) 300 MG capsule    Sig: Take 1 capsule (300 mg total) by mouth at bedtime.    Dispense:  30 capsule    Refill:  0   Dextromethorphan-buPROPion ER (AUVELITY) 45-105 MG TBCR    Sig: Take 1 tablet by mouth in the morning and at bedtime.    Dispense:  180 tablet    Refill:  2   promethazine-dextromethorphan (PROMETHAZINE-DM) 6.25-15 MG/5ML syrup    Sig: Take 5 mLs by mouth 4 (four) times daily as needed for cough.    Dispense:  118 mL    Refill:  0   lisinopril-hydrochlorothiazide (ZESTORETIC) 20-25 MG tablet    Sig: Take 1 tablet by mouth daily.    Dispense:  30 tablet    Refill:  2   No orders of the defined types were placed in this encounter.    Return in about 4 weeks (around 08/19/2022).     Jerrol Banana, MD, [facility]   Primary Care Sports Medicine Primary Care and Sports Medicine at [facility]

## 2022-08-19 ENCOUNTER — Encounter: Payer: Self-pay | Admitting: Family Medicine

## 2022-08-19 ENCOUNTER — Ambulatory Visit: Payer: BC Managed Care – PPO | Admitting: Family Medicine

## 2022-08-19 VITALS — BP 128/84 | HR 72 | Ht 68.0 in | Wt >= 6400 oz

## 2022-08-19 DIAGNOSIS — I10 Essential (primary) hypertension: Secondary | ICD-10-CM | POA: Diagnosis not present

## 2022-08-19 DIAGNOSIS — F418 Other specified anxiety disorders: Secondary | ICD-10-CM

## 2022-08-19 DIAGNOSIS — Z6841 Body Mass Index (BMI) 40.0 and over, adult: Secondary | ICD-10-CM

## 2022-08-19 DIAGNOSIS — E662 Morbid (severe) obesity with alveolar hypoventilation: Secondary | ICD-10-CM

## 2022-08-19 DIAGNOSIS — G8929 Other chronic pain: Secondary | ICD-10-CM

## 2022-08-19 DIAGNOSIS — J309 Allergic rhinitis, unspecified: Secondary | ICD-10-CM

## 2022-08-19 DIAGNOSIS — M5442 Lumbago with sciatica, left side: Secondary | ICD-10-CM | POA: Diagnosis not present

## 2022-08-19 DIAGNOSIS — M5441 Lumbago with sciatica, right side: Secondary | ICD-10-CM

## 2022-08-19 MED ORDER — CELECOXIB 100 MG PO CAPS: 100.0000 mg | ORAL_CAPSULE | Freq: Two times a day (BID) | ORAL | 0 refills | Status: DC | PRN

## 2022-08-19 MED ORDER — BENZONATATE 100 MG PO CAPS
100.0000 mg | ORAL_CAPSULE | Freq: Three times a day (TID) | ORAL | 1 refills | Status: DC | PRN
Start: 2022-08-19 — End: 2022-09-17

## 2022-08-19 MED ORDER — GABAPENTIN 300 MG PO CAPS
300.0000 mg | ORAL_CAPSULE | Freq: Every day | ORAL | 2 refills | Status: DC
Start: 2022-08-19 — End: 2022-11-28

## 2022-08-19 MED ORDER — LISINOPRIL-HYDROCHLOROTHIAZIDE 10-12.5 MG PO TABS: 1.0000 | ORAL_TABLET | Freq: Every day | ORAL | 0 refills | Status: DC

## 2022-08-19 MED ORDER — BENZONATATE 100 MG PO CAPS
100.0000 mg | ORAL_CAPSULE | Freq: Three times a day (TID) | ORAL | 1 refills | Status: DC | PRN
Start: 2022-08-19 — End: 2022-08-19

## 2022-08-19 NOTE — Progress Notes (Signed)
Primary Care / Sports Medicine Office Visit  Patient Information:  Patient ID: Stacey Hensley, female DOB: 25-Mar-1980 Age: 42 y.o. MRN: [id number]   Stacey Hensley is a pleasant 42 y.o. female presenting with the following:  Chief Complaint  Patient presents with   depression with anxiety    Vitals:   08/19/22 1326  BP: 128/84  Pulse: 72  SpO2: 96%   Vitals:   08/19/22 1326  Weight: (!) 428 lb (194.1 kg)  Height: 5\' 8"  (1.727 m)   Body mass index is 65.08 kg/m.  No results found.   Independent interpretation of notes and tests performed by another provider:   None  Procedures performed:   None  Pertinent History, Exam, Impression, and Recommendations:   Stacey Hensley was seen today for depression with anxiety.  Chronic bilateral low back pain with bilateral sciatica -     Gabapentin; Take 1 capsule (300 mg total) by mouth at bedtime.  Dispense: 30 capsule; Refill: 2 -     Celecoxib; Take 1 capsule (100 mg total) by mouth 2 (two) times daily as needed.  Dispense: 60 capsule; Refill: 0  Allergic rhinitis, unspecified seasonality, unspecified trigger -     Benzonatate; Take 1 capsule (100 mg total) by mouth 3 (three) times daily as needed for cough.  Dispense: 30 capsule; Refill: 1  Hypertension, unspecified type -     Lisinopril-hydroCHLOROthiazide; Take 1 tablet by mouth daily.  Dispense: 90 tablet; Refill: 0  Class 3 obesity with alveolar hypoventilation, serious comorbidity, and body mass index (BMI) of 60.0 to 69.9 in adult [facility]) Assessment & Plan:    08/19/2022    1:26 PM 07/22/2022   10:39 AM 07/22/2022   10:34 AM 01/21/2022   10:46 AM 11/08/2021    9:31 AM 10/29/2021   10:41 AM 10/01/2021   10:34 AM  Vitals with BMI  Height 5\' 8"   5\' 8"  5\' 8"  5\' 8"  5\' 9"  5\' 9"   Weight 428 lbs  413 lbs 416 lbs 413 lbs 10 oz 421 lbs 424 lbs  BMI 65.09  62.81 63.27 62.9 62.14 62.59  Systolic 128 148 595 128 118 148 160  Diastolic 84 102 100 82 57 92 100  Pulse 72  68  78 68 78 88   Patient has noted upward fluctuating weight trend over serial measurements. Has been trying lifestyle modifications, is eager to lose weight. We reviewed current BMI and treatments, plan as follows:  - Referral placed to bariatrics for evaluation - Make lifestyle changes where you are able to do so  Orders: -     Amb Referral to Bariatric Surgery  Depression with anxiety Overview: Previous treatments: - Sertraline  Assessment & Plan: Patient started and titrated Auvelity, currently tolerating well. Has noted improvement overall.  - Continue Auvelity twice daily    I provided a total time of 51 minutes including both face-to-face and non-face-to-face time on 08/20/2022 inclusive of time utilized for medical chart review, information gathering, care coordination with staff, and documentation completion.   Orders & Medications Meds ordered this encounter  Medications   lisinopril-hydrochlorothiazide (ZESTORETIC) 10-12.5 MG tablet    Sig: Take 1 tablet by mouth daily.    Dispense:  90 tablet    Refill:  0   gabapentin (NEURONTIN) 300 MG capsule    Sig: Take 1 capsule (300 mg total) by mouth at bedtime.    Dispense:  30 capsule    Refill:  2   DISCONTD: benzonatate (  TESSALON) 100 MG capsule    Sig: Take 1 capsule (100 mg total) by mouth 3 (three) times daily as needed for cough.    Dispense:  30 capsule    Refill:  1   celecoxib (CELEBREX) 100 MG capsule    Sig: Take 1 capsule (100 mg total) by mouth 2 (two) times daily as needed.    Dispense:  60 capsule    Refill:  0   benzonatate (TESSALON) 100 MG capsule    Sig: Take 1 capsule (100 mg total) by mouth 3 (three) times daily as needed for cough.    Dispense:  30 capsule    Refill:  1   Orders Placed This Encounter  Procedures   Amb Referral to Bariatric Surgery     No follow-ups on file.     Jerrol Banana, MD, [facility]   Primary Care Sports Medicine Primary Care and Sports Medicine at [facility]

## 2022-08-20 NOTE — Assessment & Plan Note (Addendum)
    08/19/2022    1:26 PM 07/22/2022   10:39 AM 07/22/2022   10:34 AM 01/21/2022   10:46 AM 11/08/2021    9:31 AM 10/29/2021   10:41 AM 10/01/2021   10:34 AM  Vitals with BMI  Height 5\' 8"   5\' 8"  5\' 8"  5\' 8"  5\' 9"  5\' 9"   Weight 428 lbs  413 lbs 416 lbs 413 lbs 10 oz 421 lbs 424 lbs  BMI 65.09  62.81 63.27 62.9 62.14 62.59  Systolic 128 148 161 128 118 148 160  Diastolic 84 102 100 82 57 92 100  Pulse 72  68 78 68 78 88   Patient has noted upward fluctuating weight trend over serial measurements. Has been trying lifestyle modifications, is eager to lose weight. We reviewed current BMI and treatments, plan as follows:  - Referral placed to bariatrics for evaluation - Make lifestyle changes where you are able to do so

## 2022-08-20 NOTE — Assessment & Plan Note (Signed)
Patient started and titrated Auvelity, currently tolerating well. Has noted improvement overall.  - Continue Auvelity twice daily

## 2022-08-20 NOTE — Patient Instructions (Addendum)
-   Referral placed to bariatrics for evaluation - Make lifestyle changes where you are able to do so - Continue Auvelity twice daily

## 2022-09-13 ENCOUNTER — Encounter: Payer: Self-pay | Admitting: Family Medicine

## 2022-09-13 NOTE — Telephone Encounter (Signed)
Please advise 

## 2022-09-13 NOTE — Telephone Encounter (Signed)
Pt response.  KP

## 2022-09-17 ENCOUNTER — Telehealth (INDEPENDENT_AMBULATORY_CARE_PROVIDER_SITE_OTHER): Payer: BC Managed Care – PPO | Admitting: Family Medicine

## 2022-09-17 ENCOUNTER — Encounter: Payer: Self-pay | Admitting: Family Medicine

## 2022-09-17 VITALS — BP 156/88

## 2022-09-17 DIAGNOSIS — M5442 Lumbago with sciatica, left side: Secondary | ICD-10-CM | POA: Diagnosis not present

## 2022-09-17 DIAGNOSIS — R600 Localized edema: Secondary | ICD-10-CM

## 2022-09-17 DIAGNOSIS — M5441 Lumbago with sciatica, right side: Secondary | ICD-10-CM | POA: Diagnosis not present

## 2022-09-17 DIAGNOSIS — K2100 Gastro-esophageal reflux disease with esophagitis, without bleeding: Secondary | ICD-10-CM | POA: Diagnosis not present

## 2022-09-17 DIAGNOSIS — G8929 Other chronic pain: Secondary | ICD-10-CM | POA: Diagnosis not present

## 2022-09-17 DIAGNOSIS — I10 Essential (primary) hypertension: Secondary | ICD-10-CM

## 2022-09-17 DIAGNOSIS — J309 Allergic rhinitis, unspecified: Secondary | ICD-10-CM

## 2022-09-17 MED ORDER — LISINOPRIL 10 MG PO TABS: 10.0000 mg | ORAL_TABLET | Freq: Every day | ORAL | 2 refills | Status: DC

## 2022-09-17 MED ORDER — HYDROCHLOROTHIAZIDE 25 MG PO TABS: 25.0000 mg | ORAL_TABLET | Freq: Every day | ORAL | 2 refills | Status: DC

## 2022-09-17 MED ORDER — WEGOVY 0.25 MG/0.5ML ~~LOC~~ SOAJ
0.2500 mg | SUBCUTANEOUS | 0 refills | Status: DC
Start: 2022-09-17 — End: 2022-12-09

## 2022-09-17 MED ORDER — BENZONATATE 100 MG PO CAPS
100.0000 mg | ORAL_CAPSULE | Freq: Three times a day (TID) | ORAL | 1 refills | Status: DC | PRN
Start: 2022-09-17 — End: 2023-03-31

## 2022-09-17 MED ORDER — FAMOTIDINE 20 MG PO TABS: 20.0000 mg | ORAL_TABLET | Freq: Two times a day (BID) | ORAL | 2 refills | Status: AC

## 2022-09-17 MED ORDER — PANTOPRAZOLE SODIUM 40 MG PO TBEC
40.0000 mg | DELAYED_RELEASE_TABLET | Freq: Every day | ORAL | 2 refills | Status: DC
Start: 2022-09-17 — End: 2023-07-07

## 2022-09-17 MED ORDER — MOMETASONE FUROATE 50 MCG/ACT NA SUSP: NASAL | 6 refills | Status: AC

## 2022-09-17 NOTE — Patient Instructions (Addendum)
-   Start new single pill lisinopril daily - Start new single pill hydrochlorothiazide - Take pantoprazole every AM on empty stomach 30 minutes before food - Take famotidine twice daily 30 minutes before food - Use Nasonex, 1 spray in each nostril twice daily - Can use gabapentin at night for low back / nerve pain - Can use Tessalon Perles for cough as-needed - Stop Celebrex and do not take any NSAIDs (ibuprofen, naproxen) - Referral coordinator will contact you to schedule visits with cardiology and gastroenterology - [facility] sent in, if covered by insurance, keep and do not start until reviewed during visit - Return in 2 weeks

## 2022-09-17 NOTE — Telephone Encounter (Signed)
fyi

## 2022-09-17 NOTE — Assessment & Plan Note (Signed)
Due to comorbid condition (reflux), advised full discontinuation of Celebrex and as needed nightly dosing of gabapentin

## 2022-09-17 NOTE — Assessment & Plan Note (Signed)
Patient reports progressive lower extremity edema, lifestyle components reviewed, independently doubled prior lisinopril as HCTZ 10-12.5 with some improvement though began to note postprandial emesis.  Was able to obtain blood pressure reading today however this is without medication on board.  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  Plan: - Have written for separate lisinopril 10 mg and hydrochlorothiazide 25 mg tablets - Heavy lifestyle modification encouraged - Referral placed to cardiology for further evaluation and optimization

## 2022-09-17 NOTE — Assessment & Plan Note (Signed)
Chronic symptomatology, still symptomatic, uncertain about recent pantoprazole dosing.  Noting regurgitation of food contents following intake, no significant abdominal and/or epigastric pain, describes 1-2 smooth formed bowel movements daily.  - Strongly encouraged daily dosing of pantoprazole - Restart Pepcid twice daily - Referral placed to gastroenterology for further evaluation optimization

## 2022-09-17 NOTE — Telephone Encounter (Signed)
My chart visit today at 420.

## 2022-09-17 NOTE — Progress Notes (Signed)
Primary Care / Sports Medicine Virtual Visit  Patient Information:  Patient ID: Stacey Hensley, female DOB: [birth year] Age: 42 y.o. MRN: [id number]   Stacey Hensley is a pleasant 42 y.o. female presenting with the following:  Chief Complaint  Patient presents with   Hypertension    BP meds, working better with taking 2, was half dose but legs were swelling more, the vomiting is happening when she eats coughs until the vomits     Review of Systems: No fevers, chills, night sweats, weight loss, chest pain, or shortness of breath.   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 08/23/2019   Morbid obesity with body mass index of 60.0-69.9 in adult [facility] [place]) 05/31/2019   Varicose veins of bilateral lower extremities with other complications 05/31/2019   Past Medical History:  Diagnosis Date   Allergy    Arthritis    Depression with anxiety 07/22/2022   Fracture of humerus, right, closed 07/22/2011   Hypertension 10/01/2021   OSA (obstructive sleep apnea) 10/01/2021   Outpatient Encounter Medications as of 09/17/2022  Medication Sig   Dextromethorphan-buPROPion ER (AUVELITY) 45-105 MG TBCR Take 1 tablet by mouth in the morning and at bedtime.   gabapentin (NEURONTIN) 300 MG capsule Take 1 capsule (300 mg total) by mouth at bedtime.   hydrochlorothiazide (HYDRODIURIL) 25 MG tablet Take 1 tablet (25 mg total) by mouth  daily.   lisinopril (ZESTRIL) 10 MG tablet Take 1 tablet (10 mg total) by mouth daily.   WEGOVY 0.25 MG/0.5ML SOAJ Inject 0.25 mg into the skin once a week. Use this dose for 1 month (4 shots) and then increase to next higher dose.   [DISCONTINUED] benzonatate (TESSALON) 100 MG capsule Take 1 capsule (100 mg total) by mouth 3 (three) times daily as needed for cough.   [DISCONTINUED] celecoxib (CELEBREX) 100 MG capsule Take 1 capsule (100 mg total) by mouth 2 (two) times daily as needed.   [DISCONTINUED] famotidine (PEPCID) 20 MG tablet Take by mouth.   [DISCONTINUED] lisinopril-hydrochlorothiazide (ZESTORETIC) 10-12.5 MG tablet Take 1 tablet by mouth daily.   [DISCONTINUED] mometasone (NASONEX) 50 MCG/ACT nasal spray One spray in each nostril twice a day, use left hand for right nostril, and right hand for left nostril.  Please dispense one bottle.   [DISCONTINUED] pantoprazole (PROTONIX) 40 MG tablet Take 1 tablet (40 mg total) by mouth daily. Take on empty stomach at least 30 minutes prior to food.   benzonatate (TESSALON) 100 MG capsule Take 1 capsule (100 mg total) by mouth 3 (three) times daily as needed for cough.   famotidine (PEPCID) 20 MG tablet Take 1 tablet (20 mg total) by mouth 2 (two) times daily.   mometasone (NASONEX) 50 MCG/ACT nasal spray One spray in each nostril twice a day, use left hand for right nostril, and right hand for left nostril.  Please dispense one bottle.   pantoprazole (PROTONIX) 40 MG tablet Take 1 tablet (40 mg  total) by mouth daily. Take on empty stomach at least 30 minutes prior to food.   No facility-administered encounter medications on file as of 09/17/2022.   Past Surgical History:  Procedure Laterality Date   FRACTURE SURGERY Right 07/2011   RIght arm    Virtual Visit via MyChart Video:   I connected with Stacey Hensley on 09/17/22 via MyChart Video and verified that I am speaking with the correct person using appropriate identifiers.   The  limitations, risks, security and privacy concerns of performing an evaluation and management service by MyChart Video, including the higher likelihood of inaccurate diagnoses and treatments, and the availability of in person appointments were reviewed. The possible need of an additional face-to-face encounter for complete and high quality delivery of care was discussed. The patient was also made aware that there may be a patient responsible charge related to this service. The patient expressed understanding and wishes to proceed.  Provider location is in medical facility. Patient location is in their parked car, different from provider location. People involved in care of the patient during this telehealth encounter were myself, my nurse/medical assistant, and my front office/scheduling team member.  Objective findings:   General: Speaking full sentences, no audible heavy breathing. Sounds alert and appropriately interactive. Well-appearing. Face symmetric. Extraocular movements intact. Pupils equal and round. No nasal flaring or accessory muscle use visualized.  Independent interpretation of notes and tests performed by another provider:   None  Pertinent History, Exam, Impression, and Recommendations:   Chronic bilateral low back pain with bilateral sciatica Due to comorbid condition (reflux), advised full discontinuation of Celebrex and as needed nightly dosing of gabapentin  Gastroesophageal reflux disease with esophagitis Chronic symptomatology, still symptomatic, uncertain about recent pantoprazole dosing.  Noting regurgitation of food contents following intake, no significant abdominal and/or epigastric pain, describes 1-2 smooth formed bowel movements daily.  - Strongly encouraged daily dosing of pantoprazole - Restart Pepcid twice daily - Referral placed to gastroenterology for further evaluation optimization  Hypertension Patient reports progressive lower extremity edema,  lifestyle components reviewed, independently doubled prior lisinopril as HCTZ 10-12.5 with some improvement though began to note postprandial emesis.  Was able to obtain blood pressure reading today however this is without medication on board.  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  Plan: - Have written for separate lisinopril 10 mg and hydrochlorothiazide 25 mg tablets - Heavy lifestyle modification encouraged - Referral placed to cardiology for further evaluation and optimization  Orders & Medications Meds ordered this encounter  Medications   benzonatate (TESSALON) 100 MG capsule    Sig: Take 1 capsule (100 mg total) by mouth 3 (three) times daily as needed for cough.    Dispense:  30 capsule    Refill:  1   famotidine (PEPCID) 20 MG tablet    Sig: Take 1 tablet (20 mg total) by mouth 2 (two) times daily.    Dispense:  60 tablet    Refill:  2   lisinopril (ZESTRIL) 10 MG tablet    Sig: Take 1 tablet (10 mg total) by mouth daily.    Dispense:  30 tablet    Refill:  2   hydrochlorothiazide (HYDRODIURIL) 25 MG tablet    Sig: Take 1 tablet (25 mg total) by mouth daily.    Dispense:  30 tablet    Refill:  2  mometasone (NASONEX) 50 MCG/ACT nasal spray    Sig: One spray in each nostril twice a day, use left hand for right nostril, and right hand for left nostril.  Please dispense one bottle.    Dispense:  1 g    Refill:  6   pantoprazole (PROTONIX) 40 MG tablet    Sig: Take 1 tablet (40 mg total) by mouth daily. Take on empty stomach at least 30 minutes prior to food.    Dispense:  30 tablet    Refill:  2   WEGOVY 0.25 MG/0.5ML SOAJ    Sig: Inject 0.25 mg into the skin once a week. Use this dose for 1 month (4 shots) and then increase to next higher dose.    Dispense:  2 mL    Refill:  0   Orders Placed This Encounter  Procedures    Ambulatory referral to Gastroenterology   Ambulatory referral to Cardiology     I discussed the above assessment and treatment plan with the patient. The patient was provided an opportunity to ask questions and all were answered. The patient agreed with the plan and demonstrated an understanding of the instructions.   The patient was advised to call back or seek an in-person evaluation if the symptoms worsen or if the condition fails to improve as anticipated.   I provided a total time of 40 minutes including both face-to-face and non-face-to-face time on 09/17/2022 inclusive of time utilized for medical chart review, information gathering, care coordination with staff, and documentation completion.    Jerrol Banana, MD, [facility]   Primary Care Sports Medicine Primary Care and Sports Medicine at [facility]

## 2022-10-28 DIAGNOSIS — F419 Anxiety disorder, unspecified: Secondary | ICD-10-CM | POA: Diagnosis not present

## 2022-10-28 DIAGNOSIS — G4733 Obstructive sleep apnea (adult) (pediatric): Secondary | ICD-10-CM | POA: Diagnosis not present

## 2022-10-28 DIAGNOSIS — Z885 Allergy status to narcotic agent status: Secondary | ICD-10-CM | POA: Diagnosis not present

## 2022-10-28 DIAGNOSIS — F32A Depression, unspecified: Secondary | ICD-10-CM | POA: Diagnosis not present

## 2022-10-28 DIAGNOSIS — M549 Dorsalgia, unspecified: Secondary | ICD-10-CM | POA: Diagnosis not present

## 2022-10-28 DIAGNOSIS — M546 Pain in thoracic spine: Secondary | ICD-10-CM | POA: Diagnosis not present

## 2022-10-28 DIAGNOSIS — R079 Chest pain, unspecified: Secondary | ICD-10-CM | POA: Diagnosis not present

## 2022-10-28 DIAGNOSIS — R918 Other nonspecific abnormal finding of lung field: Secondary | ICD-10-CM | POA: Diagnosis not present

## 2022-10-28 DIAGNOSIS — Z9109 Other allergy status, other than to drugs and biological substances: Secondary | ICD-10-CM | POA: Diagnosis not present

## 2022-10-28 DIAGNOSIS — I10 Essential (primary) hypertension: Secondary | ICD-10-CM | POA: Diagnosis not present

## 2022-10-28 DIAGNOSIS — E785 Hyperlipidemia, unspecified: Secondary | ICD-10-CM | POA: Diagnosis not present

## 2022-10-29 ENCOUNTER — Ambulatory Visit: Payer: Self-pay | Admitting: *Deleted

## 2022-10-29 DIAGNOSIS — M549 Dorsalgia, unspecified: Secondary | ICD-10-CM | POA: Diagnosis not present

## 2022-10-29 DIAGNOSIS — R918 Other nonspecific abnormal finding of lung field: Secondary | ICD-10-CM | POA: Diagnosis not present

## 2022-10-29 NOTE — Telephone Encounter (Signed)
//   Summary: Back/Chest pain, denied triage   Pt called requesting to speak to a nurse regarding her visit to the ED. Please advise  Best contact:  [phone]  Says she just left the ED, was seen for back/chest pain. Says she is going to bed now and cannot speak to a nurse since she has been up since last night. Scheduled for hosp fu tomorrow per her request.     Reason for Disposition  [1] SEVERE back pain (e.g., excruciating, unable to do any normal activities) AND [2] not improved 2 hours after pain medicine    Patient was seen and evaluated for pain in ED- 10/21- she has follow up appointment with PCP- tomorrow.  Answer Assessment - Initial Assessment Questions 1. ONSET: "When did the pain begin?"      2 days 2. LOCATION: "Where does it hurt?" (upper, mid or lower back)     Left-upper back- makes chest hurt 3. SEVERITY: "How bad is the pain?"  (e.g., Scale 1-10; mild, moderate, or severe)   - MILD (1-3): Doesn't interfere with normal activities.    - MODERATE (4-7): Interferes with normal activities or awakens from sleep.    - SEVERE (8-10): Excruciating pain, unable to do any normal activities.      severe 4. PATTERN: "Is the pain constant?" (e.g., yes, no; constant, intermittent)      Only relief when sleeping 5. RADIATION: "Does the pain shoot into your legs or somewhere else?"     Into chest 6. CAUSE:  "What do you think is causing the back pain?"      unsure 7. BACK OVERUSE:  "Any recent lifting of heavy objects, strenuous work or exercise?"     Works in [employer] 8. MEDICINES: "What have you taken so far for the pain?" (e.g., nothing, acetaminophen, NSAIDS)     Ibuprofen, tylenol- OTC 9. NEUROLOGIC SYMPTOMS: "Do you have any weakness, numbness, or problems with bowel/bladder control?"     no 10. OTHER SYMPTOMS: "Do you have any other symptoms?" (e.g., fever, abdomen pain, burning with urination, blood in urine)       hematuria  Protocols used: Back  Pain-A-AH

## 2022-10-29 NOTE — Telephone Encounter (Signed)
  Chief Complaint: back pain- ED follow up Symptoms: severe upper left back pain Frequency: started 2 days ago Pertinent Negatives: Patient denies weakness, numbness, or problems with bowel/bladder control Disposition: [] ED /[] Urgent Care (no appt availability in office) / [x] Appointment(In office/virtual)/ []  [facility]/ [] Home Care/ [] Refused Recommended Disposition /[] [place] [facility]/ []  Follow-up with PCP Additional Notes: Patient had called office to report she was seen at ED for evaluation of her severe back pain. Patient was released home with instructions to follow up with PCP within the week. Patient has scheduled an appointment with provider tomorrow. Patient advised to call office if her symptoms should change before that appointment.

## 2022-10-30 ENCOUNTER — Encounter: Payer: Self-pay | Admitting: Family Medicine

## 2022-10-30 ENCOUNTER — Ambulatory Visit: Payer: BC Managed Care – PPO | Admitting: Family Medicine

## 2022-10-30 VITALS — BP 148/84 | HR 61 | Ht 68.0 in | Wt >= 6400 oz

## 2022-10-30 DIAGNOSIS — Z23 Encounter for immunization: Secondary | ICD-10-CM

## 2022-10-30 DIAGNOSIS — M62830 Muscle spasm of back: Secondary | ICD-10-CM | POA: Diagnosis not present

## 2022-10-30 MED ORDER — CYCLOBENZAPRINE HCL 5 MG PO TABS: 5.0000 mg | ORAL_TABLET | Freq: Three times a day (TID) | ORAL | 0 refills | Status: DC | PRN

## 2022-10-30 MED ORDER — CELECOXIB 100 MG PO CAPS: 100.0000 mg | ORAL_CAPSULE | Freq: Two times a day (BID) | ORAL | 0 refills | Status: DC | PRN

## 2022-10-30 MED ORDER — PREDNISONE 50 MG PO TABS: 50.0000 mg | ORAL_TABLET | Freq: Every day | ORAL | 0 refills | Status: DC

## 2022-10-30 NOTE — Progress Notes (Signed)
Primary Care / Sports Medicine Office Visit  Patient Information:  Patient ID: Stacey Hensley, female DOB: 11-29-1980 Age: 42 y.o. MRN: [id number]   Stacey Hensley is a pleasant 42 y.o. female presenting with the following:  Chief Complaint  Patient presents with   Hospitalization Follow-up    No chest pain, having back pain, SOB when walking     Vitals:   10/30/22 1016  BP: (!) 148/84  Pulse: 61  SpO2: 99%   Vitals:   10/30/22 1016  Weight: (!) 440 lb (199.6 kg)  Height: 5\' 8"  (1.727 m)   Body mass index is 66.9 kg/m.  No results found.   Independent interpretation of notes and tests performed by another provider:   None  Procedures performed:   None  Pertinent History, Exam, Impression, and Recommendations:   Problem List Items Addressed This Visit       Other   Spasm of thoracic back muscle - Primary    Patient presents for acute on chronic spine pain, has known history of degenerative changes through the cervical spine.  Pain localized to the left paraspinal thoracic region, severity had increased so much that she went to [facility] ER on 10/29/2022 where she was ruled out from a cardiopulmonary standpoint, advised ibuprofen and follow-up.  Examination today demonstrates reassuring oxygen saturation, clear lung fields bilaterally without wheezes, rales, rhonchi, symmetric chest expansion, significant tenderness to the left lower trapezius, rhomboids, somewhat to the teres minor/major regions, no sensorimotor deficits, equivocal Kemps testing.  She has findings of paraspinal thoracic muscular spasm in the setting of known underlying degenerative changes.  Plan as follows: - Use 1-2 tablets cyclobenzaprine (Flexeril) up to 3 times a day as needed for muscle tightness pain, side effect can be drowsiness (no driving x 8 hours after dosing this medication) - Use Celebrex twice daily sparingly (take with food) - For persistent/worsening pain, start prednisone  (stop Celebrex, can continue Flexeril) - Can contact us to consider trigger point injections      Relevant Medications   celecoxib (CELEBREX) 100 MG capsule   cyclobenzaprine (FLEXERIL) 5 MG tablet   predniSONE (DELTASONE) 50 MG tablet   Other Visit Diagnoses     Need for influenza vaccination       Relevant Orders   Flu vaccine trivalent PF, 6mos and older(Flulaval,Afluria,Fluarix,Fluzone) (Completed)   Need for COVID-19 vaccine       Relevant Orders   Pfizer Comirnaty Covid -19 Vaccine 54yrs and older (Completed)        Orders & Medications Medications:  Meds ordered this encounter  Medications   celecoxib (CELEBREX) 100 MG capsule    Sig: Take 1 capsule (100 mg total) by mouth 2 (two) times daily as needed.    Dispense:  60 capsule    Refill:  0   cyclobenzaprine (FLEXERIL) 5 MG tablet    Sig: Take 1-2 tablets (5-10 mg total) by mouth 3 (three) times daily as needed for muscle spasms.    Dispense:  30 tablet    Refill:  0   predniSONE (DELTASONE) 50 MG tablet    Sig: Take 1 tablet (50 mg total) by mouth daily.    Dispense:  5 tablet    Refill:  0   Orders Placed This Encounter  Procedures   Flu vaccine trivalent PF, 6mos and older(Flulaval,Afluria,Fluarix,Fluzone)   Pfizer Comirnaty Covid -19 Vaccine 105yrs and older     Return for Change visit to mid-end December.  Jerrol Banana, MD, [facility]   Primary Care Sports Medicine Primary Care and Sports Medicine at [facility]

## 2022-10-30 NOTE — Assessment & Plan Note (Signed)
Patient presents for acute on chronic spine pain, has known history of degenerative changes through the cervical spine.  Pain localized to the left paraspinal thoracic region, severity had increased so much that she went to [facility] ER on 10/29/2022 where she was ruled out from a cardiopulmonary standpoint, advised ibuprofen and follow-up.  Examination today demonstrates reassuring oxygen saturation, clear lung fields bilaterally without wheezes, rales, rhonchi, symmetric chest expansion, significant tenderness to the left lower trapezius, rhomboids, somewhat to the teres minor/major regions, no sensorimotor deficits, equivocal Kemps testing.  She has findings of paraspinal thoracic muscular spasm in the setting of known underlying degenerative changes.  Plan as follows: - Use 1-2 tablets cyclobenzaprine (Flexeril) up to 3 times a day as needed for muscle tightness pain, side effect can be drowsiness (no driving x 8 hours after dosing this medication) - Use Celebrex twice daily sparingly (take with food) - For persistent/worsening pain, start prednisone (stop Celebrex, can continue Flexeril) - Can contact us to consider trigger point injections

## 2022-10-30 NOTE — Patient Instructions (Signed)
-   Use 1-2 tablets cyclobenzaprine (Flexeril) up to 3 times a day as needed for muscle tightness pain, side effect can be drowsiness (no driving x 8 hours after dosing this medication) - Use Celebrex twice daily sparingly (take with food) - For persistent/worsening pain, start prednisone (stop Celebrex, can continue Flexeril) - Can contact us to consider trigger point injections

## 2022-11-11 ENCOUNTER — Ambulatory Visit: Payer: BC Managed Care – PPO | Admitting: Gastroenterology

## 2022-11-11 NOTE — Progress Notes (Deleted)
Gastroenterology Consultation  Referring Provider:     Jerrol Banana, MD Primary Care Physician:  Jerrol Banana, MD Primary Gastroenterologist:  Dr. Servando Snare     Reason for Consultation:     GERD        HPI:   Stacey Hensley is a 42 y.o. y/o female referred for consultation & management of GERD by Dr. Ashley Royalty, Ocie Bob, MD. this patient comes to see me after being seen by her primary care provider back on September.  The patients recommendations were:  "Gastroesophageal reflux disease with esophagitis Chronic symptomatology, still symptomatic, uncertain about recent pantoprazole dosing.  Noting regurgitation of food contents following intake, no significant abdominal and/or epigastric pain, describes 1-2 smooth formed bowel movements daily.   - Strongly encouraged daily dosing of pantoprazole - Restart Pepcid twice daily - Referral placed to gastroenterology for further evaluation optimization"     Past Medical History:  Diagnosis Date   Allergy    Arthritis    Depression with anxiety 07/22/2022   Fracture of humerus, right, closed 07/22/2011   Hypertension 10/01/2021   OSA (obstructive sleep apnea) 10/01/2021    Past Surgical History:  Procedure Laterality Date   FRACTURE SURGERY Right 07/2011   RIght arm    Prior to Admission medications   Medication Sig Start Date End Date Taking? Authorizing Provider  benzonatate (TESSALON) 100 MG capsule Take 1 capsule (100 mg total) by mouth 3 (three) times daily as needed for cough. 09/17/22   Jerrol Banana, MD  celecoxib (CELEBREX) 100 MG capsule Take 1 capsule (100 mg total) by mouth 2 (two) times daily as needed. 10/30/22   Jerrol Banana, MD  cyclobenzaprine (FLEXERIL) 5 MG tablet Take 1-2 tablets (5-10 mg total) by mouth 3 (three) times daily as needed for muscle spasms. 10/30/22   Jerrol Banana, MD  famotidine (PEPCID) 20 MG tablet Take 1 tablet (20 mg total) by mouth 2 (two) times daily. 09/17/22   Jerrol Banana, MD  gabapentin (NEURONTIN) 300 MG capsule Take 1 capsule (300 mg total) by mouth at bedtime. 08/19/22   Jerrol Banana, MD  hydrochlorothiazide (HYDRODIURIL) 25 MG tablet Take 1 tablet (25 mg total) by mouth daily. 09/17/22   Jerrol Banana, MD  lisinopril (ZESTRIL) 10 MG tablet Take 1 tablet (10 mg total) by mouth daily. 09/17/22   Jerrol Banana, MD  mometasone (NASONEX) 50 MCG/ACT nasal spray One spray in each nostril twice a day, use left hand for right nostril, and right hand for left nostril.  Please dispense one bottle. 09/17/22   Jerrol Banana, MD  pantoprazole (PROTONIX) 40 MG tablet Take 1 tablet (40 mg total) by mouth daily. Take on empty stomach at least 30 minutes prior to food. 09/17/22   Jerrol Banana, MD  predniSONE (DELTASONE) 50 MG tablet Take 1 tablet (50 mg total) by mouth daily. 10/30/22   Jerrol Banana, MD  WEGOVY 0.25 MG/0.5ML SOAJ Inject 0.25 mg into the skin once a week. Use this dose for 1 month (4 shots) and then increase to next higher dose. 09/17/22   Jerrol Banana, MD    Family History  Problem Relation Age of Onset   Arthritis Mother    COPD Mother    Diabetes Mother    Hypertension Mother    Kidney disease Mother    Miscarriages / [place] Mother    Stroke Mother    Arthritis Maternal Aunt  Cancer Maternal Aunt    Arthritis Cousin      Social History   Tobacco Use   Smoking status: Never   Smokeless tobacco: Never  Vaping Use   Vaping status: Never Used  Substance Use Topics   Alcohol use: No   Drug use: No    Allergies as of 11/11/2022 - Review Complete 10/30/2022  Allergen Reaction Noted   Oxycodone-acetaminophen Other (See Comments) 08/08/2011   Percocet [oxycodone-acetaminophen] Other (See Comments) 03/25/2014   Tape Other (See Comments) 03/25/2014   Tapentadol Other (See Comments) 03/25/2014    Review of Systems:    All systems reviewed and negative except where noted in HPI.   Physical Exam:  There  were no vitals taken for this visit. No LMP recorded. General:   Alert,  Well-developed, well-nourished, pleasant and cooperative in NAD Head:  Normocephalic and atraumatic. Eyes:  Sclera clear, no icterus.   Conjunctiva pink. Ears:  Normal auditory acuity. Neck:  Supple; no masses or thyromegaly. Lungs:  Respirations even and unlabored.  Clear throughout to auscultation.   No wheezes, crackles, or rhonchi. No acute distress. Heart:  Regular rate and rhythm; no murmurs, clicks, rubs, or gallops. Abdomen:  Normal bowel sounds.  No bruits.  Soft, non-tender and non-distended without masses, hepatosplenomegaly or hernias noted.  No guarding or rebound tenderness.  Negative Carnett sign.   Rectal:  Deferred.  Pulses:  Normal pulses noted. Extremities:  No clubbing or edema.  No cyanosis. Neurologic:  Alert and oriented x3;  grossly normal neurologically. Skin:  Intact without significant lesions or rashes.  No jaundice. Lymph Nodes:  No significant cervical adenopathy. Psych:  Alert and cooperative. Normal mood and affect.  Imaging Studies: No results found.  Assessment and Plan:   Stacey Hensley is a 42 y.o. y/o female ***    Stacey Minium, MD. Stacey Hensley    Note: This dictation was prepared with Dragon dictation along with smaller phrase technology. Any transcriptional errors that result from this process are unintentional.

## 2022-11-25 ENCOUNTER — Other Ambulatory Visit: Payer: Self-pay | Admitting: Family Medicine

## 2022-11-25 ENCOUNTER — Ambulatory Visit: Payer: BC Managed Care – PPO | Admitting: Family Medicine

## 2022-11-25 DIAGNOSIS — G8929 Other chronic pain: Secondary | ICD-10-CM

## 2022-11-25 DIAGNOSIS — M62830 Muscle spasm of back: Secondary | ICD-10-CM

## 2022-11-27 NOTE — Telephone Encounter (Signed)
Requested medication (s) are due for refill today:   Yes for both  Requested medication (s) are on the active medication list:   Yes for both  Future visit scheduled:   Yes 12/16   Last ordered: Gabapentin 08/19/2022 #30, 2 refills;   Celebrex 10/30/2022 #60, 0 refills  Unable to refill because labs are due per protocol    Requested Prescriptions  Pending Prescriptions Disp Refills   gabapentin (NEURONTIN) 300 MG capsule [Pharmacy Med Name: GABAPENTIN 300MG  CAPSULES] 30 capsule 2    Sig: TAKE 1 CAPSULE(300 MG) BY MOUTH AT BEDTIME     Neurology: Anticonvulsants - gabapentin Failed - 11/25/2022  8:14 PM      Failed - Cr in normal range and within 360 days    Creatinine  Date Value Ref Range Status  10/30/2012 1.06 0.60 - 1.30 mg/dL Final   Creatinine, Ser  Date Value Ref Range Status  10/29/2021 0.92 0.57 - 1.00 mg/dL Final         Passed - Completed PHQ-2 or PHQ-9 in the last 360 days      Passed - Valid encounter within last 12 months    Recent Outpatient Visits           4 weeks ago Spasm of thoracic back muscle   [place] [facility] Royalty, Ocie Bob, MD   2 months ago Hypertension, unspecified type   [facility] Ashley Royalty, Ocie Bob, MD   3 months ago Chronic bilateral low back pain with bilateral sciatica   [place] [facility], Ocie Bob, MD   4 months ago Depression with anxiety   [place] [facility], Ocie Bob, MD   10 months ago Allergic rhinitis, unspecified seasonality, unspecified trigger   [place] [facility], Ocie Bob, MD       Future Appointments             In 1 week Debbe Odea, MD [facility] [place]   In 3 weeks Ashley Royalty, Ocie Bob, MD [facility] at [facility]             celecoxib (CELEBREX) 100 MG capsule [Pharmacy Med Name: CELECOXIB 100MG  CAPSULES] 60 capsule 0    Sig: TAKE 1 CAPSULE(100 MG) BY MOUTH TWICE DAILY AS NEEDED     Analgesics:  COX2 Inhibitors Failed - 11/25/2022  8:14 PM      Failed - Manual Review: Labs are only required if the patient has taken medication for more than 8 weeks.      Failed - HGB in normal range and within 360 days    Hemoglobin  Date Value Ref Range Status  10/29/2021 11.4 11.1 - 15.9 g/dL Final         Failed - Cr in normal range and within 360 days    Creatinine  Date Value Ref Range Status  10/30/2012 1.06 0.60 - 1.30 mg/dL Final   Creatinine, Ser  Date Value Ref Range Status  10/29/2021 0.92 0.57 - 1.00 mg/dL Final         Failed - HCT in normal range and within 360 days    Hematocrit  Date Value Ref Range Status  10/29/2021 34.8 34.0 - 46.6 % Final  Failed - AST in normal range and within 360 days    AST  Date Value Ref Range Status  10/29/2021 9 0 - 40 IU/L Final   SGOT(AST)  Date Value Ref Range Status  10/30/2012 22 15 - 37 Unit/L Final         Failed - ALT in normal range and within 360 days    ALT  Date Value Ref Range Status  10/29/2021 12 0 - 32 IU/L Final   SGPT (ALT)  Date Value Ref Range Status  10/30/2012 21 12 - 78 U/L Final         Failed - eGFR is 30 or above and within 360 days    EGFR (African American)  Date Value Ref Range Status  10/30/2012 >60  Final   GFR calc Af Amer  Date Value Ref Range Status  12/31/2014 >60 >60 mL/min Final    Comment:    (NOTE) The eGFR has been calculated using the CKD EPI equation. This calculation has not been validated in all clinical situations. eGFR's persistently <60 mL/min signify possible Chronic Kidney Disease.    EGFR (Non-African Amer.)  Date Value Ref Range Status  10/30/2012 >60  Final    Comment:    eGFR values <26mL/min/1.73 m2 may be an indication of chronic kidney disease  (CKD). Calculated eGFR is useful in patients with stable renal function. The eGFR calculation will not be reliable in acutely ill patients when serum creatinine is changing rapidly. It is not useful in  patients on dialysis. The eGFR calculation may not be applicable to patients at the low and high extremes of body sizes, pregnant women, and vegetarians.    GFR calc non Af Amer  Date Value Ref Range Status  12/31/2014 >60 >60 mL/min Final   eGFR  Date Value Ref Range Status  10/29/2021 80 >59 mL/min/1.73 Final         Passed - Patient is not pregnant      Passed - Valid encounter within last 12 months    Recent Outpatient Visits           4 weeks ago Spasm of thoracic back muscle   [place] [facility], Ocie Bob, MD   2 months ago Hypertension, unspecified type   [facility], Ocie Bob, MD   3 months ago Chronic bilateral low back pain with bilateral sciatica   [place] [facility], Ocie Bob, MD   4 months ago Depression with anxiety   [facility], Ocie Bob, MD   10 months ago Allergic rhinitis, unspecified seasonality, unspecified trigger   [place] [facility], Ocie Bob, MD       Future Appointments             In 1 week Agbor-Etang, Arlys John, MD [facility] at [place]   In 3 weeks Ashley Royalty, Ocie Bob, MD [facility] at [facility], [facility]

## 2022-11-27 NOTE — Telephone Encounter (Signed)
Spoke to pt she said that the medication is helping.  KP

## 2022-11-27 NOTE — Telephone Encounter (Signed)
Please review.  KP

## 2022-12-05 ENCOUNTER — Encounter: Payer: Self-pay | Admitting: Family Medicine

## 2022-12-09 ENCOUNTER — Ambulatory Visit: Payer: BC Managed Care – PPO | Attending: Cardiology | Admitting: Cardiology

## 2022-12-09 ENCOUNTER — Telehealth: Payer: Self-pay | Admitting: Pharmacy Technician

## 2022-12-09 ENCOUNTER — Encounter: Payer: Self-pay | Admitting: Cardiology

## 2022-12-09 ENCOUNTER — Other Ambulatory Visit (HOSPITAL_COMMUNITY): Payer: Self-pay

## 2022-12-09 VITALS — BP 148/86 | HR 79 | Ht 68.0 in | Wt >= 6400 oz

## 2022-12-09 DIAGNOSIS — Z6841 Body Mass Index (BMI) 40.0 and over, adult: Secondary | ICD-10-CM | POA: Diagnosis not present

## 2022-12-09 DIAGNOSIS — I10 Essential (primary) hypertension: Secondary | ICD-10-CM

## 2022-12-09 DIAGNOSIS — R0602 Shortness of breath: Secondary | ICD-10-CM | POA: Diagnosis not present

## 2022-12-09 MED ORDER — SEMAGLUTIDE-WEIGHT MANAGEMENT 0.5 MG/0.5ML ~~LOC~~ SOAJ
0.5000 mg | SUBCUTANEOUS | 0 refills | Status: AC
Start: 2023-01-07 — End: 2023-02-04

## 2022-12-09 MED ORDER — TORSEMIDE 20 MG PO TABS: 20.0000 mg | ORAL_TABLET | Freq: Every day | ORAL | 3 refills | Status: DC

## 2022-12-09 MED ORDER — SEMAGLUTIDE-WEIGHT MANAGEMENT 1 MG/0.5ML ~~LOC~~ SOAJ: 1.0000 mg | SUBCUTANEOUS | 0 refills | Status: DC

## 2022-12-09 MED ORDER — SEMAGLUTIDE-WEIGHT MANAGEMENT 1.7 MG/0.75ML ~~LOC~~ SOAJ: 1.7000 mg | SUBCUTANEOUS | 0 refills | Status: DC

## 2022-12-09 MED ORDER — SEMAGLUTIDE-WEIGHT MANAGEMENT 2.4 MG/0.75ML ~~LOC~~ SOAJ: 2.4000 mg | SUBCUTANEOUS | 0 refills | Status: DC

## 2022-12-09 MED ORDER — SEMAGLUTIDE-WEIGHT MANAGEMENT 0.25 MG/0.5ML ~~LOC~~ SOAJ
0.2500 mg | SUBCUTANEOUS | 0 refills | Status: AC
Start: 2022-12-09 — End: 2023-01-06

## 2022-12-09 NOTE — Progress Notes (Signed)
Cardiology Office Note:    Date:  12/09/2022   ID:  Stacey Hensley, DOB 09-04-1980, MRN [id number]  PCP:  Jerrol Banana, MD   [facility] Providers Cardiologist:  None     Referring MD: Jerrol Banana, MD   Chief Complaint  Patient presents with   New Patient (Initial Visit)    Referred for cardiac evaluation of hypertension and bilateral lower extremity edema.  No personal cardiac history.   MEHGAN Hensley is a 42 y.o. female who is being seen today for the evaluation of hypertension at the request of Ashley Royalty Ocie Bob, MD.   History of Present Illness:    ALLESON Hensley is a 42 y.o. female with a hx of hypertension, morbid obese ED who presents with leg edema and shortness of breath.  Patient has noticed shortness of breath with gaining weight over the past 3 to 4 months.  Has also noticed leg edema, saw her primary care physician who started her on HCTZ 25 mg daily.  She states edema has improved, although still present.  Denies chest pain, endorses shortness of breath with exertion.  Compliant with medications as prescribed.  Past Medical History:  Diagnosis Date   Allergy    Arthritis    Depression with anxiety 07/22/2022   Fracture of humerus, right, closed 07/22/2011   Hypertension 10/01/2021   OSA (obstructive sleep apnea) 10/01/2021    Past Surgical History:  Procedure Laterality Date   FRACTURE SURGERY Right 07/2011   RIght arm    Current Medications: Current Meds  Medication Sig   benzonatate (TESSALON) 100 MG capsule Take 1 capsule (100 mg total) by mouth 3 (three) times daily as needed for cough.   celecoxib (CELEBREX) 100 MG capsule TAKE 1 CAPSULE(100 MG) BY MOUTH TWICE DAILY AS NEEDED   cyclobenzaprine (FLEXERIL) 5 MG tablet Take 1-2 tablets (5-10 mg total) by mouth 3 (three) times daily as needed for muscle spasms.   famotidine (PEPCID) 20 MG tablet Take 1 tablet (20 mg total) by mouth 2 (two) times daily.   gabapentin  (NEURONTIN) 300 MG capsule TAKE 1 CAPSULE(300 MG) BY MOUTH AT BEDTIME   lisinopril (ZESTRIL) 10 MG tablet Take 1 tablet (10 mg total) by mouth daily.   mometasone (NASONEX) 50 MCG/ACT nasal spray One spray in each nostril twice a day, use left hand for right nostril, and right hand for left nostril.  Please dispense one bottle.   pantoprazole (PROTONIX) 40 MG tablet Take 1 tablet (40 mg total) by mouth daily. Take on empty stomach at least 30 minutes prior to food.   Semaglutide-Weight Management 0.25 MG/0.5ML SOAJ Inject 0.25 mg into the skin once a week for 28 days.   [START ON 01/07/2023] Semaglutide-Weight Management 0.5 MG/0.5ML SOAJ Inject 0.5 mg into the skin once a week for 28 days.   [START ON 02/05/2023] Semaglutide-Weight Management 1 MG/0.5ML SOAJ Inject 1 mg into the skin once a week for 28 days.   [START ON 03/06/2023] Semaglutide-Weight Management 1.7 MG/0.75ML SOAJ Inject 1.7 mg into the skin once a week for 28 days.   [START ON 04/04/2023] Semaglutide-Weight Management 2.4 MG/0.75ML SOAJ Inject 2.4 mg into the skin once a week for 28 days.   torsemide (DEMADEX) 20 MG tablet Take 1 tablet (20 mg total) by mouth daily.   [DISCONTINUED] hydrochlorothiazide (HYDRODIURIL) 25 MG tablet Take 1 tablet (25 mg total) by mouth daily.     Allergies:   Oxycodone-acetaminophen, Percocet [oxycodone-acetaminophen], Tape, and Tapentadol  Social History   Socioeconomic History   Marital status: Single    Spouse name: Not on file   Number of children: Not on file   Years of education: Not on file   Highest education level: 12th grade  Occupational History   Not on file  Tobacco Use   Smoking status: Never   Smokeless tobacco: Never  Vaping Use   Vaping status: Never Used  Substance and Sexual Activity   Alcohol use: No   Drug use: No   Sexual activity: Not Currently  Other Topics Concern   Not on file  Social History Narrative   Not on file   Social Determinants of Health    Financial Resource Strain: Low Risk  (08/18/2022)   Overall Financial Resource Strain (CARDIA)    Difficulty of Paying Living Expenses: Not hard at all  Food Insecurity: No Food Insecurity (08/18/2022)   Hunger Vital Sign    Worried About Running Out of Food in the Last Year: Never true    Ran Out of Food in the Last Year: Never true  Transportation Needs: No Transportation Needs (08/18/2022)   PRAPARE - Administrator, Civil Service (Medical): No    Lack of Transportation (Non-Medical): No  Physical Activity: Insufficiently Active (08/18/2022)   Exercise Vital Sign    Days of Exercise per Week: 3 days    Minutes of Exercise per Session: 30 min  Stress: No Stress Concern Present (08/18/2022)   [employer] of Occupational Health - Occupational Stress Questionnaire    Feeling of Stress : Not at all  Social Connections: Moderately Integrated (08/18/2022)   Social Connection and Isolation Panel [NHANES]    Frequency of Communication with Friends and Family: More than three times a week    Frequency of Social Gatherings with Friends and Family: More than three times a week    Attends Religious Services: More than 4 times per year    Active Member of [employer] or Organizations: Yes    Attends Banker Meetings: 1 to 4 times per year    Marital Status: Never married     Family History: The patient's family history includes Arthritis in her cousin, maternal aunt, and mother; COPD in her mother; Cancer in her maternal aunt; Diabetes in her mother; Heart attack in her father and mother; Hypertension in her mother; Kidney disease in her mother; Miscarriages / Stillbirths in her mother; Stroke in her mother.  ROS:   Please see the history of present illness.     All other systems reviewed and are negative.  EKGs/Labs/Other Studies Reviewed:    The following studies were reviewed today:  EKG Interpretation Date/Time:  Monday December 09 2022 11:20:14 EST Ventricular  Rate:  79 PR Interval:  180 QRS Duration:  80 QT Interval:  374 QTC Calculation: 428 R Axis:   11  Text Interpretation: Normal sinus rhythm Cannot rule out Anterior infarct , age undetermined Confirmed by Debbe Odea ([id number]) on 12/09/2022 11:36:48 AM    Recent Labs: No results found for requested labs within last 365 days.  Recent Lipid Panel    Component Value Date/Time   CHOL 184 10/29/2021 1128   TRIG 110 10/29/2021 1128   HDL 43 10/29/2021 1128   CHOLHDL 4.3 10/29/2021 1128   LDLCALC 121 (H) 10/29/2021 1128     Risk Assessment/Calculations:         Physical Exam:    VS:  BP (!) 148/86 (BP Location: Left Arm, Patient  Position: Sitting, Cuff Size: Large)   Pulse 79   Ht 5\' 8"  (1.727 m)   Wt (!) 428 lb 6.4 oz (194.3 kg)   SpO2 98%   BMI 65.14 kg/m     Wt Readings from Last 3 Encounters:  12/09/22 (!) 428 lb 6.4 oz (194.3 kg)  10/30/22 (!) 440 lb (199.6 kg)  08/19/22 (!) 428 lb (194.1 kg)     GEN:  Well nourished, well developed in no acute distress HEENT: Normal NECK: No JVD; No carotid bruits CARDIAC: RRR, no murmurs, rubs, gallops RESPIRATORY: Diminished breath sounds, no wheezing. ABDOMEN: Soft, non-tender, non-distended MUSCULOSKELETAL:  No edema; No deformity  SKIN: Warm and dry NEUROLOGIC:  Alert and oriented x 3 PSYCHIATRIC:  Normal affect   ASSESSMENT:    1. Shortness of breath   2. Primary hypertension   3. Morbid obesity with body mass index of 60.0-69.9 in adult [facility])    PLAN:    In order of problems listed above:  Shortness of breath, 1+ edema.  Morbid obesity likely etiology.  Obtain echocardiogram.  Start torsemide 20 mg daily.  Check BMP in 10 days. Hypertension, BP elevated, torsemide 20 mg daily, continue lisinopril 10 mg daily.  If BP elevated at follow-up visit, titrate lisinopril. Morbid obesity, low-calorie diet, weight loss advised.  Start [employer].  Follow-up after echo.     Medication Adjustments/Labs and Tests  Ordered: Current medicines are reviewed at length with the patient today.  Concerns regarding medicines are outlined above.  Orders Placed This Encounter  Procedures   Basic metabolic panel   EKG 12-Lead   ECHOCARDIOGRAM COMPLETE   Meds ordered this encounter  Medications   torsemide (DEMADEX) 20 MG tablet    Sig: Take 1 tablet (20 mg total) by mouth daily.    Dispense:  60 tablet    Refill:  3   Semaglutide-Weight Management 0.25 MG/0.5ML SOAJ    Sig: Inject 0.25 mg into the skin once a week for 28 days.    Dispense:  2 mL    Refill:  0   Semaglutide-Weight Management 0.5 MG/0.5ML SOAJ    Sig: Inject 0.5 mg into the skin once a week for 28 days.    Dispense:  2 mL    Refill:  0   Semaglutide-Weight Management 1 MG/0.5ML SOAJ    Sig: Inject 1 mg into the skin once a week for 28 days.    Dispense:  2 mL    Refill:  0   Semaglutide-Weight Management 1.7 MG/0.75ML SOAJ    Sig: Inject 1.7 mg into the skin once a week for 28 days.    Dispense:  3 mL    Refill:  0   Semaglutide-Weight Management 2.4 MG/0.75ML SOAJ    Sig: Inject 2.4 mg into the skin once a week for 28 days.    Dispense:  3 mL    Refill:  0    Patient Instructions  Medication Instructions:  Your physician has recommended you make the following change in your medication:   STOP Hydrochlorothiazide  START Torsemide 20 mg once daily  3. Start taking Wegovy  Month 1: 0.25 mg once a week.  Month 2: 0.5 mg once a week. (Call or send [place] a MyChart message when you are on week 2, so we can send your next dose in for you).  Month 3: 1 mg once a week.  Month 4: 1.7 mg once a week.  Month 5 and beyond: 2.4 mg once a  week (maintenance dose)  *If you need a refill on your cardiac medications before your next appointment, please call your pharmacy*   Lab Work: BMP in 10 DAYS here in our office. Lab is open 08:00 am to 4:30 pm and closed daily 1-2 for lunch.   If you have labs (blood work) drawn today and your  tests are completely normal, you will receive your results only by: MyChart Message (if you have MyChart) OR A paper copy in the mail If you have any lab test that is abnormal or we need to change your treatment, we will call you to review the results.   Testing/Procedures: Your physician has requested that you have an echocardiogram. Echocardiography is a painless test that uses sound waves to create images of your heart. It provides your doctor with information about the size and shape of your heart and how well your heart's chambers and valves are working. This procedure takes approximately one hour. There are no restrictions for this procedure. Please do NOT wear cologne, perfume, aftershave, or lotions (deodorant is allowed). Please arrive 15 minutes prior to your appointment time.  Please note: We ask at that you not bring children with you during ultrasound (echo/ vascular) testing. Due to room size and safety concerns, children are not allowed in the ultrasound rooms during exams. Our front office staff cannot provide observation of children in our lobby area while testing is being conducted. An adult accompanying a patient to their appointment will only be allowed in the ultrasound room at the discretion of the ultrasound technician under special circumstances. We apologize for any inconvenience.    Follow-Up: At [facility], you and your health needs are our priority.  As part of our continuing mission to provide you with exceptional heart care, we have created designated Provider Care Teams.  These Care Teams include your primary Cardiologist (physician) and Advanced Practice Providers (APPs -  Physician Assistants and Nurse Practitioners) who all work together to provide you with the care you need, when you need it.   Your next appointment:   2 month(s)  Provider:   Debbe Odea, MD    Signed, Debbe Odea, MD  12/09/2022 12:19 PM    [facility]

## 2022-12-09 NOTE — Telephone Encounter (Signed)
Pharmacy Patient Advocate Encounter   Received notification from Fax that prior authorization for wegovy is required/requested.   Insurance verification completed.   The patient is insured through [facility] .   Per test claim: PA required; PA submitted to above mentioned insurance via CoverMyMeds Key/confirmation #/EOC [id number] Status is pending

## 2022-12-09 NOTE — Patient Instructions (Addendum)
Medication Instructions:  Your physician has recommended you make the following change in your medication:   STOP Hydrochlorothiazide  START Torsemide 20 mg once daily  3. Start taking Wegovy  Month 1: 0.25 mg once a week.  Month 2: 0.5 mg once a week. (Call or send [place] a MyChart message when you are on week 2, so we can send your next dose in for you).  Month 3: 1 mg once a week.  Month 4: 1.7 mg once a week.  Month 5 and beyond: 2.4 mg once a week (maintenance dose)  *If you need a refill on your cardiac medications before your next appointment, please call your pharmacy*   Lab Work: BMP in 10 DAYS here in our office. Lab is open 08:00 am to 4:30 pm and closed daily 1-2 for lunch.   If you have labs (blood work) drawn today and your tests are completely normal, you will receive your results only by: MyChart Message (if you have MyChart) OR A paper copy in the mail If you have any lab test that is abnormal or we need to change your treatment, we will call you to review the results.   Testing/Procedures: Your physician has requested that you have an echocardiogram. Echocardiography is a painless test that uses sound waves to create images of your heart. It provides your doctor with information about the size and shape of your heart and how well your heart's chambers and valves are working. This procedure takes approximately one hour. There are no restrictions for this procedure. Please do NOT wear cologne, perfume, aftershave, or lotions (deodorant is allowed). Please arrive 15 minutes prior to your appointment time.  Please note: We ask at that you not bring children with you during ultrasound (echo/ vascular) testing. Due to room size and safety concerns, children are not allowed in the ultrasound rooms during exams. Our front office staff cannot provide observation of children in our lobby area while testing is being conducted. An adult accompanying a patient to their appointment  will only be allowed in the ultrasound room at the discretion of the ultrasound technician under special circumstances. We apologize for any inconvenience.    Follow-Up: At [facility], you and your health needs are our priority.  As part of our continuing mission to provide you with exceptional heart care, we have created designated Provider Care Teams.  These Care Teams include your primary Cardiologist (physician) and Advanced Practice Providers (APPs -  Physician Assistants and Nurse Practitioners) who all work together to provide you with the care you need, when you need it.   Your next appointment:   2 month(s)  Provider:   Debbe Odea, MD

## 2022-12-11 NOTE — Telephone Encounter (Signed)
Pharmacy Patient Advocate Encounter  Received notification from [facility] that Prior Authorization for wegovy has been APPROVED from 12/09/22 to 04/14/23   PA #/Case ID/Reference #: [id number]

## 2022-12-20 ENCOUNTER — Other Ambulatory Visit (HOSPITAL_COMMUNITY): Payer: Self-pay

## 2022-12-20 DIAGNOSIS — Z6841 Body Mass Index (BMI) 40.0 and over, adult: Secondary | ICD-10-CM | POA: Diagnosis not present

## 2022-12-20 DIAGNOSIS — I10 Essential (primary) hypertension: Secondary | ICD-10-CM | POA: Diagnosis not present

## 2022-12-20 DIAGNOSIS — R0602 Shortness of breath: Secondary | ICD-10-CM | POA: Diagnosis not present

## 2022-12-20 NOTE — Telephone Encounter (Signed)
Patient came by stating [facility] advised her that someone from doctor's office needs to call them to authorize filling of Wegovy. Please advise.

## 2022-12-21 LAB — BASIC METABOLIC PANEL
BUN/Creatinine Ratio: 24 — ABNORMAL HIGH (ref 9–23)
BUN: 22 mg/dL (ref 6–24)
CO2: 26 mmol/L (ref 20–29)
Calcium: 9.4 mg/dL (ref 8.7–10.2)
Chloride: 102 mmol/L (ref 96–106)
Creatinine, Ser: 0.9 mg/dL (ref 0.57–1.00)
Glucose: 103 mg/dL — ABNORMAL HIGH (ref 70–99)
Potassium: 4.4 mmol/L (ref 3.5–5.2)
Sodium: 143 mmol/L (ref 134–144)
eGFR: 81 mL/min/{1.73_m2} (ref >59)

## 2022-12-23 ENCOUNTER — Ambulatory Visit: Payer: BC Managed Care – PPO | Admitting: Family Medicine

## 2022-12-23 ENCOUNTER — Encounter: Payer: Self-pay | Admitting: Family Medicine

## 2022-12-23 VITALS — BP 120/84 | HR 83 | Ht 68.0 in | Wt >= 6400 oz

## 2022-12-23 DIAGNOSIS — F418 Other specified anxiety disorders: Secondary | ICD-10-CM | POA: Diagnosis not present

## 2022-12-23 DIAGNOSIS — M26622 Arthralgia of left temporomandibular joint: Secondary | ICD-10-CM | POA: Diagnosis not present

## 2022-12-23 DIAGNOSIS — I10 Essential (primary) hypertension: Secondary | ICD-10-CM

## 2022-12-23 DIAGNOSIS — G8929 Other chronic pain: Secondary | ICD-10-CM

## 2022-12-23 DIAGNOSIS — Z6841 Body Mass Index (BMI) 40.0 and over, adult: Secondary | ICD-10-CM

## 2022-12-23 DIAGNOSIS — E66813 Obesity, class 3: Secondary | ICD-10-CM | POA: Diagnosis not present

## 2022-12-23 DIAGNOSIS — E662 Morbid (severe) obesity with alveolar hypoventilation: Secondary | ICD-10-CM

## 2022-12-23 DIAGNOSIS — M5442 Lumbago with sciatica, left side: Secondary | ICD-10-CM

## 2022-12-23 DIAGNOSIS — M5441 Lumbago with sciatica, right side: Secondary | ICD-10-CM

## 2022-12-23 MED ORDER — CYCLOBENZAPRINE HCL 10 MG PO TABS: 10.0000 mg | ORAL_TABLET | Freq: Every evening | ORAL | 0 refills | Status: DC | PRN

## 2022-12-23 MED ORDER — LISINOPRIL 10 MG PO TABS: 10.0000 mg | ORAL_TABLET | Freq: Every day | ORAL | 3 refills | Status: AC

## 2022-12-23 MED ORDER — BUPROPION HCL ER (XL) 150 MG PO TB24: ORAL_TABLET | ORAL | 1 refills | Status: DC

## 2022-12-23 NOTE — Assessment & Plan Note (Signed)
Over the interim she has established with cardiology, HCTZ transition to torsemide, excellent response reported.  She has upcoming visit for echocardiogram scheduled.  Will continue to follow peripherally on this issue.  Hypertension Well controlled on Lisinopril 10mg  daily and Torsemide. -Continue current regimen. -Refill Lisinopril 10mg  for 90 days.

## 2022-12-23 NOTE — Patient Instructions (Addendum)
- **  TEMPOROMANDIBULAR JOINT SYNDROME:**   - Continue Celebrex for inflammation.   - Take cyclobenzaprine (muscle relaxer) at night as needed.   - Consider referral to a dentist or ENT specialist if no improvement.  - **HYPERTENSION:**   - Blood pressure is well controlled with Lisinopril and Torsemide.   - Continue current medication regimen.   - Refill Lisinopril.  - **MOOD DISORDER:**   - Start Bupropion 150 mg once daily to help regulate mood.   - Video visit follow-up  - **WEIGHT MANAGEMENT:**   - Continue current regimen.   - Monitor for gastrointestinal side effects from [facility].   - Consider adding Metamucil to help with bowel regulation if needed. (See below)  Constipation Stepwise Treatment  Find a consistent over-the-counter regimen that allows for at least 1-2 smooth, formed, bowel movements daily. See steps below and reserve Steps 5 and 6 for times without a bowel movement x 2-3 days despite regular OTC regimen.

## 2022-12-23 NOTE — Progress Notes (Signed)
Primary Care / Sports Medicine Office Visit  Patient Information:  Patient ID: Stacey Hensley, female DOB: 1980/02/22 Age: 42 y.o. MRN: [id number]   Stacey Hensley is a pleasant 42 y.o. female presenting with the following:  Chief Complaint  Patient presents with   Back Pain    Patient presents today for a 3 month follow up on her thoracic back spasms. Overall her symptoms have improved since her last visit.   Oral Pain    Patient has some left sided mouth pain for about a month. She has taken IBU and Tylenol for pain but it has not helped. It is a shooting pain.     Vitals:   12/23/22 0957  BP: 120/84  Pulse: 83  SpO2: 98%   Vitals:   12/23/22 0957  Weight: (!) 418 lb (189.6 kg)  Height: 5\' 8"  (1.727 m)   Body mass index is 63.56 kg/m.  No results found.   Independent interpretation of notes and tests performed by another provider:   None  Procedures performed:   None  Pertinent History, Exam, Impression, and Recommendations:   Problem List Items Addressed This Visit       Cardiovascular and Mediastinum   Hypertension   Over the interim she has established with cardiology, HCTZ transition to torsemide, excellent response reported.  She has upcoming visit for echocardiogram scheduled.  Will continue to follow peripherally on this issue.  Hypertension Well controlled on Lisinopril 10mg  daily and Torsemide. -Continue current regimen. -Refill Lisinopril 10mg  for 90 days.      Relevant Medications   lisinopril (ZESTRIL) 10 MG tablet     Respiratory   Class 3 obesity with alveolar hypoventilation, serious comorbidity, and body mass index (BMI) of 60.0 to 69.9 in adult [facility])     Nervous and Auditory   Chronic bilateral low back pain with bilateral sciatica   Improved, the patient reports improvement in their back pain. They had been taking Celebrex and gabapentin for this condition, did not end up requiring prednisone.   -Discontinue Celebrex and  gabapentin, can revisit dosing in the future if indicated      Relevant Medications   cyclobenzaprine (FLEXERIL) 10 MG tablet   buPROPion (WELLBUTRIN XL) 150 MG 24 hr tablet     Other   Depression with anxiety   The patient also discusses their mood changes and stress levels. They express a need for a medication to help manage these symptoms.  Had previously been started on Auvelity and was doing well however was unable to continue this medication due to insurance barriers.  Mood Disorder Patient reports stress and mood changes. -Start Bupropion 150 mg extended release once daily for mood regulation. -Consider titration versus adjunct SSRI. -Video visit follow-up.      Relevant Medications   buPROPion (WELLBUTRIN XL) 150 MG 24 hr tablet   TMJ tenderness, left - Primary   Presents with a chief complaint of jaw pain. The pain is described as shooting up into the ear and is exacerbated by opening the mouth too wide. The patient reports that the pain has calmed down significantly but still occurs occasionally. The patient also mentions a popping sensation in the jaw, which may be contributing to the pain.  Physical Exam HEENT: Left tympanic membrane and canal benign. Right tympanic membrane and canal benign. No tragal tenderness bilaterally.  Dentition within normal limits, no gingival erythema or breakdown. Oropharynx benign. Non tender sinuses bilaterally. Non inflamed turbinates, non erythematous nasal  mucosa. MUSCULOSKELETAL: Tenderness at left temporomandibular joint, recreates stated symptoms.  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 No signs of sinusitis or dental issues on examination. -Restart/continue Celebrex for inflammation. -Add muscle relaxer at night as needed.  Cyclobenzaprine 10 mg nightly as needed. -Consider referral to dentist or ENT if no improvement.        I provided a total time of 40 minutes including both face-to-face and  non-face-to-face time on 12/23/2022 inclusive of time utilized for medical chart review, information gathering, care coordination with staff, and documentation completion.   Orders & Medications Medications:  Meds ordered this encounter  Medications   lisinopril (ZESTRIL) 10 MG tablet    Sig: Take 1 tablet (10 mg total) by mouth daily.    Dispense:  90 tablet    Refill:  3   cyclobenzaprine (FLEXERIL) 10 MG tablet    Sig: Take 1 tablet (10 mg total) by mouth at bedtime as needed for muscle spasms.    Dispense:  30 tablet    Refill:  0   buPROPion (WELLBUTRIN XL) 150 MG 24 hr tablet    Sig: 1 tablet daily for a month then 1 tab twice a day    Dispense:  30 tablet    Refill:  1   No orders of the defined types were placed in this encounter.    No follow-ups on file.     Jerrol Banana, MD, [facility]   Primary Care Sports Medicine Primary Care and Sports Medicine at [facility]

## 2022-12-23 NOTE — Assessment & Plan Note (Addendum)
Presents with a chief complaint of jaw pain. The pain is described as shooting up into the ear and is exacerbated by opening the mouth too wide. The patient reports that the pain has calmed down significantly but still occurs occasionally. The patient also mentions a popping sensation in the jaw, which may be contributing to the pain.  Physical Exam HEENT: Left tympanic membrane and canal benign. Right tympanic membrane and canal benign. No tragal tenderness bilaterally.  Dentition within normal limits, no gingival erythema or breakdown. Oropharynx benign. Non tender sinuses bilaterally. Non inflamed turbinates, non erythematous nasal mucosa. MUSCULOSKELETAL: Tenderness at left temporomandibular joint, recreates stated symptoms.  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 No signs of sinusitis or dental issues on examination. -Restart/continue Celebrex for inflammation. -Add muscle relaxer at night as needed.  Cyclobenzaprine 10 mg nightly as needed. -Consider referral to dentist or ENT if no improvement.

## 2022-12-23 NOTE — Assessment & Plan Note (Signed)
Improved, the patient reports improvement in their back pain. They had been taking Celebrex and gabapentin for this condition, did not end up requiring prednisone.   -Discontinue Celebrex and gabapentin, can revisit dosing in the future if indicated

## 2022-12-23 NOTE — Assessment & Plan Note (Signed)
The patient also discusses their mood changes and stress levels. They express a need for a medication to help manage these symptoms.  Had previously been started on Auvelity and was doing well however was unable to continue this medication due to insurance barriers.  Mood Disorder Patient reports stress and mood changes. -Start Bupropion 150 mg extended release once daily for mood regulation. -Consider titration versus adjunct SSRI. -Video visit follow-up.

## 2022-12-25 ENCOUNTER — Ambulatory Visit: Payer: BC Managed Care – PPO | Admitting: Family Medicine

## 2022-12-30 ENCOUNTER — Ambulatory Visit: Payer: BC Managed Care – PPO | Attending: Cardiology

## 2023-01-02 ENCOUNTER — Ambulatory Visit: Payer: BC Managed Care – PPO | Attending: Cardiology

## 2023-01-02 DIAGNOSIS — R0602 Shortness of breath: Secondary | ICD-10-CM | POA: Diagnosis not present

## 2023-01-03 LAB — ECHOCARDIOGRAM COMPLETE: Area-P 1/2: 4.06 cm2

## 2023-01-23 ENCOUNTER — Encounter: Payer: Self-pay | Admitting: Family Medicine

## 2023-01-23 ENCOUNTER — Telehealth (INDEPENDENT_AMBULATORY_CARE_PROVIDER_SITE_OTHER): Payer: BC Managed Care – PPO | Admitting: Family Medicine

## 2023-01-23 DIAGNOSIS — F418 Other specified anxiety disorders: Secondary | ICD-10-CM

## 2023-01-23 DIAGNOSIS — G8929 Other chronic pain: Secondary | ICD-10-CM

## 2023-01-23 DIAGNOSIS — M5441 Lumbago with sciatica, right side: Secondary | ICD-10-CM

## 2023-01-23 DIAGNOSIS — M26622 Arthralgia of left temporomandibular joint: Secondary | ICD-10-CM | POA: Diagnosis not present

## 2023-01-23 DIAGNOSIS — M5442 Lumbago with sciatica, left side: Secondary | ICD-10-CM

## 2023-01-23 MED ORDER — GABAPENTIN 300 MG PO CAPS: 300.0000 mg | ORAL_CAPSULE | Freq: Every day | ORAL | 1 refills | Status: DC

## 2023-01-23 MED ORDER — CYCLOBENZAPRINE HCL 10 MG PO TABS: 10.0000 mg | ORAL_TABLET | Freq: Every evening | ORAL | 0 refills | Status: DC | PRN

## 2023-01-23 MED ORDER — BUPROPION HCL ER (XL) 300 MG PO TB24: 300.0000 mg | ORAL_TABLET | Freq: Every day | ORAL | 0 refills | Status: DC

## 2023-01-23 MED ORDER — DULOXETINE HCL 30 MG PO CPEP: 30.0000 mg | ORAL_CAPSULE | Freq: Every day | ORAL | 0 refills | Status: DC

## 2023-01-23 MED ORDER — DULOXETINE HCL 60 MG PO CPEP: 60.0000 mg | ORAL_CAPSULE | Freq: Every day | ORAL | 2 refills | Status: DC

## 2023-01-28 ENCOUNTER — Other Ambulatory Visit: Payer: Self-pay | Admitting: Family Medicine

## 2023-01-28 NOTE — Telephone Encounter (Signed)
Requested Prescriptions  Refused Prescriptions Disp Refills   DULoxetine (CYMBALTA) 30 MG capsule [Pharmacy Med Name: DULOXETINE DR 30MG  CAPSULES] 7 capsule 0    Sig: TAKE 1 CAPSULE(30 MG) BY MOUTH DAILY FOR 7 DAYS. INCREASE TO 60 MG DAILY     Psychiatry: Antidepressants - SNRI - duloxetine Passed - 01/28/2023 11:55 AM      Passed - Cr in normal range and within 360 days    Creatinine  Date Value Ref Range Status  10/30/2012 1.06 0.60 - 1.30 mg/dL Final   Creatinine, Ser  Date Value Ref Range Status  12/20/2022 0.90 0.57 - 1.00 mg/dL Final         Passed - eGFR is 30 or above and within 360 days    EGFR (African American)  Date Value Ref Range Status  10/30/2012 >60  Final   GFR calc Af Amer  Date Value Ref Range Status  12/31/2014 >60 >60 mL/min Final    Comment:    (NOTE) The eGFR has been calculated using the CKD EPI equation. This calculation has not been validated in all clinical situations. eGFR's persistently <60 mL/min signify possible Chronic Kidney Disease.    EGFR (Non-African Amer.)  Date Value Ref Range Status  10/30/2012 >60  Final    Comment:    eGFR values <51mL/min/1.73 m2 may be an indication of chronic kidney disease (CKD). Calculated eGFR is useful in patients with stable renal function. The eGFR calculation will not be reliable in acutely ill patients when serum creatinine is changing rapidly. It is not useful in  patients on dialysis. The eGFR calculation may not be applicable to patients at the low and high extremes of body sizes, pregnant women, and vegetarians.    GFR calc non Af Amer  Date Value Ref Range Status  12/31/2014 >60 >60 mL/min Final   eGFR  Date Value Ref Range Status  12/20/2022 81 >59 mL/min/1.73 Final         Passed - Completed PHQ-2 or PHQ-9 in the last 360 days      Passed - Last BP in normal range    BP Readings from Last 1 Encounters:  12/23/22 120/84         Passed - Valid encounter within last 6 months     Recent Outpatient Visits           5 days ago Chronic bilateral low back pain with bilateral sciatica   [place] [facility], Ocie Bob, MD   1 month ago TMJ tenderness, left   [place] [facility] at [facility], Ocie Bob, MD   3 months ago Spasm of thoracic back muscle   [place] [facility] at [facility], Ocie Bob, MD   4 months ago Hypertension, unspecified type   [facility] & [facility] Medicine at [facility], Ocie Bob, MD   5 months ago Chronic bilateral low back pain with bilateral sciatica   [facility] Care & Sports Medicine at [facility], Ocie Bob, MD       Future Appointments             In 2 weeks Agbor-Etang, Arlys John, MD [facility] at [facility]

## 2023-01-31 NOTE — Progress Notes (Signed)
Primary Care / Sports Medicine Virtual Visit  Patient Information:  Patient ID: Stacey Hensley, female DOB: 1980/06/11 Age: 43 y.o. MRN: [id number]   Stacey Hensley is a pleasant 43 y.o. female presenting with the following:  Chief Complaint  Patient presents with   Follow-up    Patient presents today fro a follow up on her back pain ( muscle spasm) which have got better. She is also following up on her TMJ which she states is still there but has got much better since her last.     Review of Systems: No fevers, chills, night sweats, weight loss, chest pain, or shortness of breath.   Patient Active Problem List   Diagnosis Date Noted   TMJ tenderness, left 12/23/2022   Spasm of thoracic back muscle 10/30/2022   Depression with anxiety 07/22/2022   Gastroesophageal reflux disease with esophagitis 07/22/2022   Colicky abdominal pain 07/22/2022   Chronic bilateral low back pain with bilateral sciatica 07/22/2022   Allergic rhinitis 10/29/2021   Need for Tdap vaccination 10/29/2021   Hypertension 10/01/2021   Class 3 obesity with alveolar hypoventilation, serious comorbidity, and body mass index (BMI) of 60.0 to 69.9 in adult [facility]) 10/01/2021   OSA (obstructive sleep apnea) 10/01/2021   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 08/23/2019   Morbid obesity with body mass index of 60.0-69.9 in adult [facility]) 05/31/2019   Varicose veins of bilateral lower extremities with other complications 05/31/2019   Past Medical History:  Diagnosis Date   Allergy    Arthritis    Depression with anxiety 07/22/2022   Fracture of humerus, right, closed 07/22/2011   Hypertension 10/01/2021   OSA (obstructive sleep apnea) 10/01/2021   Outpatient Encounter Medications as of 01/23/2023  Medication Sig Note   benzonatate (TESSALON) 100 MG capsule Take 1 capsule (100 mg total) by mouth 3 (three) times daily as needed for cough.    buPROPion (WELLBUTRIN XL) 300 MG 24 hr tablet Take 1  tablet (300 mg total) by mouth daily.    celecoxib (CELEBREX) 100 MG capsule TAKE 1 CAPSULE(100 MG) BY MOUTH TWICE DAILY AS NEEDED 01/23/2023: PRN   DULoxetine (CYMBALTA) 30 MG capsule Take 1 capsule (30 mg total) by mouth daily for 7 days. Then increase to 60 mg daily.    DULoxetine (CYMBALTA) 60 MG capsule Take 1 capsule (60 mg total) by mouth daily.    mometasone (NASONEX) 50 MCG/ACT nasal spray One spray in each nostril twice a day, use left hand for right nostril, and right hand for left nostril.  Please dispense one bottle.    Semaglutide-Weight Management 0.5 MG/0.5ML SOAJ Inject 0.5 mg into the skin once a week for 28 days.    torsemide (DEMADEX) 20 MG tablet Take 1 tablet (20 mg total) by mouth daily.    [DISCONTINUED] buPROPion (WELLBUTRIN XL) 150 MG 24 hr tablet 1 tablet daily for a month then 1 tab twice a day    cyclobenzaprine (FLEXERIL) 10 MG tablet Take 1 tablet (10 mg total) by mouth at bedtime as needed for muscle spasms.    famotidine (PEPCID) 20 MG tablet Take 1 tablet (20 mg total) by mouth 2 (two) times daily. (Patient not taking: Reported on 01/23/2023)    gabapentin (NEURONTIN) 300 MG capsule Take 1 capsule (300 mg total) by mouth at bedtime.    lisinopril (ZESTRIL) 10 MG tablet Take 1 tablet (10 mg total) by mouth daily.    pantoprazole (PROTONIX) 40 MG tablet Take  1 tablet (40 mg total) by mouth daily. Take on empty stomach at least 30 minutes prior to food. 01/23/2023: PRN   [START ON 02/05/2023] Semaglutide-Weight Management 1 MG/0.5ML SOAJ Inject 1 mg into the skin once a week for 28 days.    [START ON 03/06/2023] Semaglutide-Weight Management 1.7 MG/0.75ML SOAJ Inject 1.7 mg into the skin once a week for 28 days.    [START ON 04/04/2023] Semaglutide-Weight Management 2.4 MG/0.75ML SOAJ Inject 2.4 mg into the skin once a week for 28 days.    [DISCONTINUED] cyclobenzaprine (FLEXERIL) 10 MG tablet Take 1 tablet (10 mg total) by mouth at bedtime as needed for muscle spasms.  (Patient not taking: Reported on 01/23/2023)    No facility-administered encounter medications on file as of 01/23/2023.   Past Surgical History:  Procedure Laterality Date   FRACTURE SURGERY Right 07/2011   RIght arm    Virtual Visit via MyChart Video:   I connected with Stacey Hensley on 01/31/23 via MyChart Video and verified that I am speaking with the correct person using appropriate identifiers.   The limitations, risks, security and privacy concerns of performing an evaluation and management service by MyChart Video, including the higher likelihood of inaccurate diagnoses and treatments, and the availability of in person appointments were reviewed. The possible need of an additional face-to-face encounter for complete and high quality delivery of care was discussed. The patient was also made aware that there may be a patient responsible charge related to this service. The patient expressed understanding and wishes to proceed.  Provider location is in medical facility. Patient location is at their home, different from provider location. People involved in care of the patient during this telehealth encounter were myself, my nurse/medical assistant, and my front office/scheduling team member.  Objective findings:   General: Speaking full sentences, no audible heavy breathing. Sounds alert and appropriately interactive. Well-appearing. Face symmetric. Extraocular movements intact. Pupils equal and round. No nasal flaring or accessory muscle use visualized.  Independent interpretation of notes and tests performed by another provider:   None  Pertinent History, Exam, Impression, and Recommendations:   Problem List Items Addressed This Visit     Chronic bilateral low back pain with bilateral sciatica - Primary   Chronic Lower Back Pain with sciatica features Dull pain in the lower back, particularly after sitting for extended periods. Intermittent radiating symptoms, however no  numbness, or tingling down the legs. Concern for spondylosis pain based on the description. -Order MRI of the lumbar spine. -Refer to interventional spine group for potential targeted injection therapy. -Start Cymbalta (duloxetine) 30mg  daily for one week, then increase to 60mg  daily for chronic pain control.      Relevant Medications   gabapentin (NEURONTIN) 300 MG capsule   buPROPion (WELLBUTRIN XL) 300 MG 24 hr tablet   cyclobenzaprine (FLEXERIL) 10 MG tablet   DULoxetine (CYMBALTA) 60 MG capsule   Other Relevant Orders   MR Lumbar Spine Wo Contrast   Depression with anxiety   Relevant Medications   buPROPion (WELLBUTRIN XL) 300 MG 24 hr tablet   DULoxetine (CYMBALTA) 60 MG capsule   TMJ tenderness, left   TMJ Improvement noted, no further action required at this time.        Orders & Medications Medications:  Meds ordered this encounter  Medications   gabapentin (NEURONTIN) 300 MG capsule    Sig: Take 1 capsule (300 mg total) by mouth at bedtime.    Dispense:  30 capsule  Refill:  1   buPROPion (WELLBUTRIN XL) 300 MG 24 hr tablet    Sig: Take 1 tablet (300 mg total) by mouth daily.    Dispense:  90 tablet    Refill:  0   cyclobenzaprine (FLEXERIL) 10 MG tablet    Sig: Take 1 tablet (10 mg total) by mouth at bedtime as needed for muscle spasms.    Dispense:  30 tablet    Refill:  0   DULoxetine (CYMBALTA) 30 MG capsule    Sig: Take 1 capsule (30 mg total) by mouth daily for 7 days. Then increase to 60 mg daily.    Dispense:  7 capsule    Refill:  0   DULoxetine (CYMBALTA) 60 MG capsule    Sig: Take 1 capsule (60 mg total) by mouth daily.    Dispense:  30 capsule    Refill:  2   Orders Placed This Encounter  Procedures   MR Lumbar Spine Wo Contrast     I discussed the above assessment and treatment plan with the patient. The patient was provided an opportunity to ask questions and all were answered. The patient agreed with the plan and demonstrated an  understanding of the instructions.   The patient was advised to call back or seek an in-person evaluation if the symptoms worsen or if the condition fails to improve as anticipated.   I provided a total time of 30 minutes including both face-to-face and non-face-to-face time on 01/31/2023 inclusive of time utilized for medical chart review, information gathering, care coordination with staff, and documentation completion.    Jerrol Banana, MD, [facility]   Primary Care Sports Medicine Primary Care and Sports Medicine at [facility]

## 2023-01-31 NOTE — Assessment & Plan Note (Signed)
TMJ Improvement noted, no further action required at this time.

## 2023-01-31 NOTE — Assessment & Plan Note (Signed)
Chronic Lower Back Pain with sciatica features Dull pain in the lower back, particularly after sitting for extended periods. Intermittent radiating symptoms, however no numbness, or tingling down the legs. Concern for spondylosis pain based on the description. -Order MRI of the lumbar spine. -Refer to interventional spine group for potential targeted injection therapy. -Start Cymbalta (duloxetine) 30mg  daily for one week, then increase to 60mg  daily for chronic pain control.

## 2023-02-04 ENCOUNTER — Ambulatory Visit: Admission: RE | Admit: 2023-02-04 | Payer: BC Managed Care – PPO | Source: Ambulatory Visit

## 2023-02-13 ENCOUNTER — Other Ambulatory Visit: Payer: Self-pay | Admitting: Family Medicine

## 2023-02-14 ENCOUNTER — Ambulatory Visit
Admission: RE | Admit: 2023-02-14 | Discharge: 2023-02-14 | Disposition: A | Discharge status: Home / Self Care | Payer: BC Managed Care – PPO | Source: Ambulatory Visit | Attending: Family Medicine | Admitting: Family Medicine

## 2023-02-14 DIAGNOSIS — M5441 Lumbago with sciatica, right side: Secondary | ICD-10-CM | POA: Insufficient documentation

## 2023-02-14 DIAGNOSIS — M47816 Spondylosis without myelopathy or radiculopathy, lumbar region: Secondary | ICD-10-CM | POA: Diagnosis not present

## 2023-02-14 DIAGNOSIS — M5442 Lumbago with sciatica, left side: Secondary | ICD-10-CM | POA: Insufficient documentation

## 2023-02-14 DIAGNOSIS — M4807 Spinal stenosis, lumbosacral region: Secondary | ICD-10-CM | POA: Diagnosis not present

## 2023-02-14 DIAGNOSIS — G8929 Other chronic pain: Secondary | ICD-10-CM | POA: Diagnosis not present

## 2023-02-14 DIAGNOSIS — M48061 Spinal stenosis, lumbar region without neurogenic claudication: Secondary | ICD-10-CM | POA: Diagnosis not present

## 2023-02-14 DIAGNOSIS — M5127 Other intervertebral disc displacement, lumbosacral region: Secondary | ICD-10-CM | POA: Diagnosis not present

## 2023-02-14 NOTE — Telephone Encounter (Signed)
 Requested Prescriptions  Refused Prescriptions Disp Refills   buPROPion  (WELLBUTRIN  XL) 150 MG 24 hr tablet [Pharmacy Med Name: BUPROPION  XL 150MG  TABLETS (24 H)] 30 tablet 1    Sig: TAKE 1 TABLET BY MOUTH DAILY FOR A MONTH THEN 1 TABLET TWICE DAILY     Psychiatry: Antidepressants - bupropion  Failed - 02/14/2023  9:24 AM      Failed - AST in normal range and within 360 days    AST  Date Value Ref Range Status  10/29/2021 9 0 - 40 IU/L Final   SGOT(AST)  Date Value Ref Range Status  10/30/2012 22 15 - 37 Unit/L Final         Failed - ALT in normal range and within 360 days    ALT  Date Value Ref Range Status  10/29/2021 12 0 - 32 IU/L Final   SGPT (ALT)  Date Value Ref Range Status  10/30/2012 21 12 - 78 U/L Final         Passed - Cr in normal range and within 360 days    Creatinine  Date Value Ref Range Status  10/30/2012 1.06 0.60 - 1.30 mg/dL Final   Creatinine, Ser  Date Value Ref Range Status  12/20/2022 0.90 0.57 - 1.00 mg/dL Final         Passed - Completed PHQ-2 or PHQ-9 in the last 360 days      Passed - Last BP in normal range    BP Readings from Last 1 Encounters:  12/23/22 120/84         Passed - Valid encounter within last 6 months    Recent Outpatient Visits           3 weeks ago Chronic bilateral low back pain with bilateral sciatica   [place] [facility], Selinda PARAS, MD   1 month ago TMJ tenderness, left   [place] [facility], Selinda PARAS, MD   3 months ago Spasm of thoracic back muscle   [place] [facility], Selinda PARAS, MD   5 months ago Hypertension, unspecified type   [facility] at [facility], Selinda PARAS, MD   5 months ago Chronic bilateral low back pain with bilateral sciatica   [facility] Primary Care & Sports Medicine at [facility], Selinda PARAS, MD       Future Appointments             In 3 days Darliss Rogue, MD [facility] at [facility]

## 2023-02-17 ENCOUNTER — Ambulatory Visit: Payer: BC Managed Care – PPO | Attending: Cardiology | Admitting: Cardiology

## 2023-02-17 ENCOUNTER — Encounter: Payer: Self-pay | Admitting: Cardiology

## 2023-02-17 VITALS — BP 134/90 | HR 83 | Ht 68.0 in | Wt >= 6400 oz

## 2023-02-17 DIAGNOSIS — Z6841 Body Mass Index (BMI) 40.0 and over, adult: Secondary | ICD-10-CM | POA: Diagnosis not present

## 2023-02-17 DIAGNOSIS — R0602 Shortness of breath: Secondary | ICD-10-CM | POA: Diagnosis not present

## 2023-02-17 DIAGNOSIS — I10 Essential (primary) hypertension: Secondary | ICD-10-CM | POA: Diagnosis not present

## 2023-02-17 MED ORDER — TORSEMIDE 20 MG PO TABS: 40.0000 mg | ORAL_TABLET | Freq: Every day | ORAL | 0 refills | Status: AC

## 2023-02-17 MED ORDER — SEMAGLUTIDE-WEIGHT MANAGEMENT 2.4 MG/0.75ML ~~LOC~~ SOAJ: 2.4000 mg | SUBCUTANEOUS | 0 refills | Status: DC

## 2023-02-17 MED ORDER — SEMAGLUTIDE-WEIGHT MANAGEMENT 1.7 MG/0.75ML ~~LOC~~ SOAJ: 1.7000 mg | SUBCUTANEOUS | 0 refills | Status: DC

## 2023-02-17 NOTE — Progress Notes (Signed)
 Cardiology Office Note:    Date:  02/17/2023   ID:  Stacey Hensley, DOB 1980-02-02, MRN [id number]  PCP:  Ma Saupe, MD   [facility] Providers Cardiologist:  None     Referring MD: Ma Saupe, MD   Chief Complaint  Patient presents with   Follow-up    Patient denies new or acute cardiac problems/concerns today.       History of Present Illness:    Stacey Hensley is a 43 y.o. female with a hx of hypertension, morbid obesity who presents for follow-up.  She was last seen with leg edema and shortness of breath.  Echocardiogram was obtained to evaluate cardiac function.  Started on torsemide  and Wegovy  to help with edema and weight loss.  Presents for cardiac testing results.  Torsemide  has helped with her edema although still present.  Tolerating Wegovy , has lost about 14 pounds since last visit.    Past Medical History:  Diagnosis Date   Allergy    Arthritis    Depression with anxiety 07/22/2022   Fracture of humerus, right, closed 07/22/2011   Hypertension 10/01/2021   OSA (obstructive sleep apnea) 10/01/2021    Past Surgical History:  Procedure Laterality Date   FRACTURE SURGERY Right 07/2011   RIght arm    Current Medications: Current Meds  Medication Sig   benzonatate  (TESSALON ) 100 MG capsule Take 1 capsule (100 mg total) by mouth 3 (three) times daily as needed for cough.   buPROPion  (WELLBUTRIN  XL) 300 MG 24 hr tablet Take 1 tablet (300 mg total) by mouth daily.   celecoxib  (CELEBREX ) 100 MG capsule TAKE 1 CAPSULE(100 MG) BY MOUTH TWICE DAILY AS NEEDED   cyclobenzaprine  (FLEXERIL ) 10 MG tablet Take 1 tablet (10 mg total) by mouth at bedtime as needed for muscle spasms.   DULoxetine  (CYMBALTA ) 60 MG capsule Take 1 capsule (60 mg total) by mouth daily.   gabapentin  (NEURONTIN ) 300 MG capsule Take 1 capsule (300 mg total) by mouth at bedtime.   lisinopril  (ZESTRIL ) 10 MG tablet Take 1 tablet (10 mg total) by mouth daily.    mometasone  (NASONEX ) 50 MCG/ACT nasal spray One spray in each nostril twice a day, use left hand for right nostril, and right hand for left nostril.  Please dispense one bottle.   pantoprazole  (PROTONIX ) 40 MG tablet Take 1 tablet (40 mg total) by mouth daily. Take on empty stomach at least 30 minutes prior to food.   [DISCONTINUED] Semaglutide -Weight Management 1 MG/0.5ML SOAJ Inject 1 mg into the skin once a week for 28 days.   [DISCONTINUED] torsemide  (DEMADEX ) 20 MG tablet Take 1 tablet (20 mg total) by mouth daily.     Allergies:   Oxycodone -acetaminophen , Percocet [oxycodone -acetaminophen ], Tape, and Tapentadol   Social History   Socioeconomic History   Marital status: Single    Spouse name: Not on file   Number of children: Not on file   Years of education: Not on file   Highest education level: 12th grade  Occupational History   Not on file  Tobacco Use   Smoking status: Never   Smokeless tobacco: Never  Vaping Use   Vaping status: Never Used  Substance and Sexual Activity   Alcohol use: No   Drug use: No   Sexual activity: Not Currently  Other Topics Concern   Not on file  Social History Narrative   Not on file   Social Drivers of Health   Financial Resource Strain: Low Risk  (  12/21/2022)   Overall Financial Resource Strain (CARDIA)    Difficulty of Paying Living Expenses: Not hard at all  Food Insecurity: No Food Insecurity (12/21/2022)   Hunger Vital Sign    Worried About Running Out of Food in the Last Year: Never true    Ran Out of Food in the Last Year: Never true  Transportation Needs: No Transportation Needs (12/21/2022)   PRAPARE - Administrator, Civil Service (Medical): No    Lack of Transportation (Non-Medical): No  Physical Activity: Insufficiently Active (12/21/2022)   Exercise Vital Sign    Days of Exercise per Week: 3 days    Minutes of Exercise per Session: 30 min  Stress: No Stress Concern Present (12/21/2022)   [employer]  of Occupational Health - Occupational Stress Questionnaire    Feeling of Stress : Not at all  Social Connections: Unknown (12/21/2022)   Social Connection and Isolation Panel [NHANES]    Frequency of Communication with Friends and Family: More than three times a week    Frequency of Social Gatherings with Friends and Family: Patient declined    Attends Religious Services: More than 4 times per year    Active Member of [employer] or Organizations: Yes    Attends Engineer, structural: More than 4 times per year    Marital Status: Patient declined     Family History: The patient's family history includes Arthritis in her cousin, maternal aunt, and mother; COPD in her mother; Cancer in her maternal aunt; Diabetes in her mother; Heart attack in her father and mother; Hypertension in her mother; Kidney disease in her mother; Miscarriages / Stillbirths in her mother; Stroke in her mother.  ROS:   Please see the history of present illness.     All other systems reviewed and are negative.  EKGs/Labs/Other Studies Reviewed:    The following studies were reviewed today:       Recent Labs: 12/20/2022: BUN 22; Creatinine, Ser 0.90; Potassium 4.4; Sodium 143  Recent Lipid Panel    Component Value Date/Time   CHOL 184 10/29/2021 1128   TRIG 110 10/29/2021 1128   HDL 43 10/29/2021 1128   CHOLHDL 4.3 10/29/2021 1128   LDLCALC 121 (H) 10/29/2021 1128     Risk Assessment/Calculations:         Physical Exam:    VS:  BP (!) 134/90 (BP Location: Left Arm, Patient Position: Sitting, Cuff Size: Large)   Pulse 83   Ht 5\' 8"  (1.727 m)   Wt (!) 414 lb 9.6 oz (188.1 kg)   SpO2 100%   BMI 63.04 kg/m     Wt Readings from Last 3 Encounters:  02/17/23 (!) 414 lb 9.6 oz (188.1 kg)  12/23/22 (!) 418 lb (189.6 kg)  12/09/22 (!) 428 lb 6.4 oz (194.3 kg)     GEN:  Well nourished, well developed in no acute distress HEENT: Normal NECK: No JVD; No carotid bruits CARDIAC: RRR, no murmurs,  rubs, gallops RESPIRATORY: Diminished breath sounds, no wheezing. ABDOMEN: Soft, non-tender, non-distended MUSCULOSKELETAL:  1+ edema; No deformity  SKIN: Warm and dry NEUROLOGIC:  Alert and oriented x 3 PSYCHIATRIC:  Normal affect   ASSESSMENT:    1. Shortness of breath   2. Primary hypertension   3. Morbid obesity with body mass index of 60.0-69.9 in adult [facility])     PLAN:    In order of problems listed above:  Shortness of breath, echocardiogram 12/24 EF 60 to 65%. Morbid obesity  likely etiology.  Leg edema, increase torsemide  to 40 mg daily.  Check BMP in 10 days.   Hypertension, BP elevated, torsemide  40 mg daily, continue lisinopril  10 mg daily.  Morbid obesity, low-calorie diet, weight loss advised.  Lost 14 pounds since starting Wegovy , continue Wegovy .  Follow-up in 4 to 5 months.     Medication Adjustments/Labs and Tests Ordered: Current medicines are reviewed at length with the patient today.  Concerns regarding medicines are outlined above.  Orders Placed This Encounter  Procedures   Basic Metabolic Panel (BMET)   Meds ordered this encounter  Medications   torsemide  (DEMADEX ) 20 MG tablet    Sig: Take 2 tablets (40 mg total) by mouth daily.    Dispense:  180 tablet    Refill:  0   Semaglutide -Weight Management 1.7 MG/0.75ML SOAJ    Sig: Inject 1.7 mg into the skin once a week for 28 days.    Dispense:  3 mL    Refill:  0   Semaglutide -Weight Management 2.4 MG/0.75ML SOAJ    Sig: Inject 2.4 mg into the skin once a week for 28 days.    Dispense:  3 mL    Refill:  0    Patient Instructions  Medication Instructions:   INCREASE Torsemide  - Take two tablet ( 40mg ) by mouth daily.   *If you need a refill on your cardiac medications before your next appointment, please call your pharmacy*   Lab Work:  Your provider would like for you to return in 10 days (02/27/2023) to have the following labs drawn: BMP.   Please go to [facility] [address] ([facility]) [address], [place] [address] You do not need an appointment.  They are open from 8 am- 4:30 pm.  Lunch from 1:00 pm- 2:00 pm You DO NOT need to be fasting.  If you have labs (blood work) drawn today and your tests are completely normal, you will receive your results only by: MyChart Message (if you have MyChart) OR A paper copy in the mail If you have any lab test that is abnormal or we need to change your treatment, we will call you to review the results.   Testing/Procedures:  1. None Ordered   Follow-Up: At [facility], you and your health needs are our priority.  As part of our continuing mission to provide you with exceptional heart care, we have created designated Provider Care Teams.  These Care Teams include your primary Cardiologist (physician) and Advanced Practice Providers (APPs -  Physician Assistants and Nurse Practitioners) who all work together to provide you with the care you need, when you need it.  We recommend signing up for the patient portal called "MyChart".  Sign up information is provided on this After Visit Summary.  MyChart is used to connect with patients for Virtual Visits (Telemedicine).  Patients are able to view lab/test results, encounter notes, upcoming appointments, etc.  Non-urgent messages can be sent to your provider as well.   To learn more about what you can do with MyChart, go to [email or web address].    Your next appointment:   5 month(s)  Provider:   Constancia Delton, MD ONLY         Signed, Constancia Delton, MD  02/17/2023 12:10 PM    [facility]

## 2023-02-17 NOTE — Patient Instructions (Addendum)
 Medication Instructions:   INCREASE Torsemide  - Take two tablet ( 40mg ) by mouth daily.   *If you need a refill on your cardiac medications before your next appointment, please call your pharmacy*   Lab Work:  Your provider would like for you to return in 10 days (02/27/2023) to have the following labs drawn: BMP.   Please go to [facility] [address] ([facility]) [address], [place] [address] You do not need an appointment.  They are open from 8 am- 4:30 pm.  Lunch from 1:00 pm- 2:00 pm You DO NOT need to be fasting.  If you have labs (blood work) drawn today and your tests are completely normal, you will receive your results only by: MyChart Message (if you have MyChart) OR A paper copy in the mail If you have any lab test that is abnormal or we need to change your treatment, we will call you to review the results.   Testing/Procedures:  1. None Ordered   Follow-Up: At [facility], you and your health needs are our priority.  As part of our continuing mission to provide you with exceptional heart care, we have created designated Provider Care Teams.  These Care Teams include your primary Cardiologist (physician) and Advanced Practice Providers (APPs -  Physician Assistants and Nurse Practitioners) who all work together to provide you with the care you need, when you need it.  We recommend signing up for the patient portal called "MyChart".  Sign up information is provided on this After Visit Summary.  MyChart is used to connect with patients for Virtual Visits (Telemedicine).  Patients are able to view lab/test results, encounter notes, upcoming appointments, etc.  Non-urgent messages can be sent to your provider as well.   To learn more about what you can do with MyChart, go to [email or web address].    Your next appointment:   5 month(s)  Provider:   Constancia Delton, MD ONLY

## 2023-03-24 ENCOUNTER — Other Ambulatory Visit: Payer: Self-pay | Admitting: Family Medicine

## 2023-03-24 DIAGNOSIS — G8929 Other chronic pain: Secondary | ICD-10-CM

## 2023-03-25 ENCOUNTER — Other Ambulatory Visit: Payer: Self-pay | Admitting: Family Medicine

## 2023-03-25 ENCOUNTER — Encounter: Payer: Self-pay | Admitting: Family Medicine

## 2023-03-25 DIAGNOSIS — G8929 Other chronic pain: Secondary | ICD-10-CM

## 2023-03-25 NOTE — Telephone Encounter (Signed)
 Requested Prescriptions  Pending Prescriptions Disp Refills   gabapentin (NEURONTIN) 300 MG capsule [Pharmacy Med Name: GABAPENTIN 300MG  CAPSULES] 30 capsule 0    Sig: TAKE 1 CAPSULE(300 MG) BY MOUTH AT BEDTIME     Neurology: Anticonvulsants - gabapentin Passed - 03/25/2023  1:33 PM      Passed - Cr in normal range and within 360 days    Creatinine  Date Value Ref Range Status  10/30/2012 1.06 0.60 - 1.30 mg/dL Final   Creatinine, Ser  Date Value Ref Range Status  12/20/2022 0.90 0.57 - 1.00 mg/dL Final         Passed - Completed PHQ-2 or PHQ-9 in the last 360 days      Passed - Valid encounter within last 12 months    Recent Outpatient Visits           2 months ago Chronic bilateral low back pain with bilateral sciatica   [place] [facility], Ocie Bob, MD   3 months ago TMJ tenderness, left   [place] [facility], Ocie Bob, MD   4 months ago Spasm of thoracic back muscle   [place] [facility], Ocie Bob, MD   6 months ago Hypertension, unspecified type   [facility] at [facility], Ocie Bob, MD   7 months ago Chronic bilateral low back pain with bilateral sciatica   [facility] at [facility], Ocie Bob, MD       Future Appointments             In 3 months Agbor-Etang, Arlys John, MD [facility]

## 2023-03-31 ENCOUNTER — Encounter: Payer: Self-pay | Admitting: Family Medicine

## 2023-03-31 ENCOUNTER — Ambulatory Visit
Admission: RE | Admit: 2023-03-31 | Discharge: 2023-03-31 | Disposition: A | Discharge status: Home / Self Care | Attending: Family Medicine | Admitting: Family Medicine

## 2023-03-31 ENCOUNTER — Other Ambulatory Visit (INDEPENDENT_AMBULATORY_CARE_PROVIDER_SITE_OTHER): Payer: Self-pay | Admitting: Radiology

## 2023-03-31 ENCOUNTER — Ambulatory Visit
Admission: RE | Admit: 2023-03-31 | Discharge: 2023-03-31 | Disposition: A | Discharge status: Home / Self Care | Source: Ambulatory Visit | Attending: Family Medicine | Admitting: Family Medicine

## 2023-03-31 ENCOUNTER — Ambulatory Visit: Admitting: Family Medicine

## 2023-03-31 VITALS — BP 140/88 | HR 78 | Ht 68.0 in | Wt >= 6400 oz

## 2023-03-31 DIAGNOSIS — G8929 Other chronic pain: Secondary | ICD-10-CM

## 2023-03-31 DIAGNOSIS — M25561 Pain in right knee: Secondary | ICD-10-CM

## 2023-03-31 DIAGNOSIS — F418 Other specified anxiety disorders: Secondary | ICD-10-CM

## 2023-03-31 DIAGNOSIS — M1711 Unilateral primary osteoarthritis, right knee: Secondary | ICD-10-CM | POA: Insufficient documentation

## 2023-03-31 MED ORDER — TRIAMCINOLONE ACETONIDE 40 MG/ML IJ SUSP
40.0000 mg | Freq: Once | INTRAMUSCULAR | Status: AC
  Administered 2023-03-31: 40 mg via INTRAMUSCULAR

## 2023-03-31 MED ORDER — BUPROPION HCL ER (XL) 300 MG PO TB24: 300.0000 mg | ORAL_TABLET | Freq: Every day | ORAL | 3 refills | Status: DC

## 2023-03-31 NOTE — Assessment & Plan Note (Signed)
 She has recently experienced significant family losses, including the deaths of her sister, brother, and aunt, which have impacted her emotional well-being. Initially, she had trouble sleeping but states her sleep has improved, although the knee pain is now affecting her rest.  Grief and bereavement Significant emotional distress due to recent losses. Initial sleep difficulty improved, but low mood persists. Currently on bupropion for mood support. Discussed behavioral therapy as adjunct to medication for grief management. Emphasized continuation of medication to prevent symptom rebound. - Continue bupropion (Wellbutrin) as prescribed. - Refer to behavioral therapy for grief counseling and support.  Referral coordinator will contact for scheduling. - Refill bupropion prescription. - Contact us if still symptomatic to discuss additional options.

## 2023-03-31 NOTE — Patient Instructions (Signed)
 You have just been given a cortisone injection to reduce pain and inflammation. After the injection you may notice immediate relief of pain as a result of the Lidocaine. It is important to rest the area of the injection for 24 to 48 hours after the injection. There is a possibility of some temporary increased discomfort and swelling for up to 72 hours until the cortisone begins to work. If you do have pain, simply rest the joint and use ice. If you can tolerate over the counter medications, you can try Tylenol, Aleve, or Advil for added relief per package instructions.  Patient Care Plan  Right Knee Pain  - Rest and avoid strenuous activities for two days after the injection. - Apply ice to your knee for 20 minutes, twice a day. - Take Celebrex 100 mg for pain as needed, up to twice daily with food, until symptoms improve with cortisone. - Get an x-ray of your right knee to check for structural issues. - Follow up if your symptoms persist or worsen in two weeks.  Emotional Well-being  - Continue taking bupropion (Wellbutrin) as prescribed. - Attend behavioral therapy for counseling. The referral coordinator will contact you to schedule this. - Your bupropion prescription will be refilled. - Contact us if your symptoms do not improve to discuss other options.  Red Flags  - Let us know if your knee pain or emotional symptoms significantly worsen or if you experience new concerning symptoms.

## 2023-03-31 NOTE — Assessment & Plan Note (Signed)
 History of Present Illness Stacey Hensley is a 43 year old female who presents with right knee pain and swelling.  She has been experiencing atraumatic right knee pain and swelling since Friday. The pain is aggravated by activities such as sitting to standing, squatting, lunging, and going up and down stairs. She describes a sensation of the knee being 'stuck' and experiences pain during walking.  Some prior issues with this knee are reported.  She has not taken Celebrex recently and is unsure if it was included in her weekly pill organizer. No medication was taken this morning. She has a history of back issues, which she feels may be aggravated by her current knee problem.  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  Physical Exam INSPECTION: No abnormalities on inspection of the right knee. PALPATION: Tenderness at the lateral and medial patellar facets, medial and lateral joint lines, and Pes Anserine bursa of the right knee. Palpable warmth present. No effusion or erythema noted. RANGE OF MOTION: Painful passive flexion of the right knee. STRENGTH: Mild discomfort with resistant right hip flexion. SPECIAL TESTS: Negative anterior and posterior drawer tests on the right knee.  Negative valgus and varus stressing, negative McMurray's.  Assessment and Plan Right knee arthralgia, acute on chronic Acute inflammation likely due to overuse or strain superimposed on an element of chronicity.  Anticipate underlying degenerative changes.  Discussed ultrasound-guided cortisone injection and agreed upon to reduce inflammation and pain. Cortisone expected to provide relief within 12 to 72 hours. Advised against overexertion post-injection and aftercare. - Administer cortisone injection to the right knee under ultrasound guidance. - Advise rest and avoidance of strenuous activities for two days. - Instruct to apply ice to the knee for 20 minutes twice daily. - Prescribe Celebrex 100 mg  as needed for pain management.  Take this up to twice daily with food until symptoms respond to cortisone. - Order x-ray of the right knee to assess for structural abnormalities. - Advise follow-up if symptoms persist or worsen in two weeks.

## 2023-03-31 NOTE — Progress Notes (Signed)
 Primary Care / Sports Medicine Office Visit  Patient Information:  Patient ID: Stacey Hensley, female DOB: [birth year] Age: 43 y.o. MRN: [id number]   Stacey Hensley is a pleasant 43 y.o. female presenting with the following:  Chief Complaint  Patient presents with   Knee Pain    Right knee pain x 3 days. Pain, popping, and cracking started 03/28/23. Patient having difficulty walking, standing, and bending/squatting. Pain level 6-7/10. Patient has been using ibuprofen and gabapentin for pain purposes, but it is not helping.     Vitals:   03/31/23 0957  BP: (!) 140/88  Pulse: 78  SpO2: 97%   Vitals:   03/31/23 0957  Weight: (!) 403 lb (182.8 kg)  Height: 5\' 8"  (1.727 m)   Body mass index is 61.28 kg/m.  No results found.   Independent interpretation of notes and tests performed by another provider:   None  Procedures performed:   Procedure:  Injection of right knee under ultrasound guidance. Ultrasound guidance utilized for anterolateral approach, no effusion noted Samsung HS60 device utilized with permanent recording / reporting. Verbal informed consent obtained and verified. Skin prepped in a sterile fashion. Ethyl chloride for topical local analgesia.  Completed without difficulty and tolerated well. Medication: triamcinolone acetonide 40 mg/mL suspension for injection 1 mL total and 2 mL lidocaine 1% without epinephrine utilized for needle placement anesthetic Advised to contact for fevers/chills, erythema, induration, drainage, or persistent bleeding.   Pertinent History, Exam, Impression, and Recommendations:   Problem List Items Addressed This Visit     Chronic pain of right knee - Primary   History of Present Illness Stacey Hensley is a 43 year old female who presents with right knee pain and swelling.  She has been experiencing atraumatic right knee pain and swelling since Friday. The pain is aggravated by activities such as sitting to  standing, squatting, lunging, and going up and down stairs. She describes a sensation of the knee being 'stuck' and experiences pain during walking.  Some prior issues with this knee are reported.  She has not taken Celebrex recently and is unsure if it was included in her weekly pill organizer. No medication was taken this morning. She has a history of back issues, which she feels may be aggravated by her current knee problem.  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  Physical Exam INSPECTION: No abnormalities on inspection of the right knee. PALPATION: Tenderness at the lateral and medial patellar facets, medial and lateral joint lines, and Pes Anserine bursa of the right knee. Palpable warmth present. No effusion or erythema noted. RANGE OF MOTION: Painful passive flexion of the right knee. STRENGTH: Mild discomfort with resistant right hip flexion. SPECIAL TESTS: Negative anterior and posterior drawer tests on the right knee.  Negative valgus and varus stressing, negative McMurray's.  Assessment and Plan Right knee arthralgia, acute on chronic Acute inflammation likely due to overuse or strain superimposed on an element of chronicity.  Anticipate underlying degenerative changes.  Discussed ultrasound-guided cortisone injection and agreed upon to reduce inflammation and pain. Cortisone expected to provide relief within 12 to 72 hours. Advised against overexertion post-injection and aftercare. - Administer cortisone injection to the right knee under ultrasound guidance. - Advise rest and avoidance of strenuous activities for two days. - Instruct to apply ice to the knee for 20 minutes twice daily. - Prescribe Celebrex 100 mg as needed for pain management.  Take this up to  twice daily with food until symptoms respond to cortisone. - Order x-ray of the right knee to assess for structural abnormalities. - Advise follow-up if symptoms persist or worsen in two weeks.       Relevant Orders   [place] LIMITED JOINT SPACE STRUCTURES LOW RIGHT   DG Knee Complete 4 Views Right   Depression with anxiety   She has recently experienced significant family losses, including the deaths of her sister, brother, and aunt, which have impacted her emotional well-being. Initially, she had trouble sleeping but states her sleep has improved, although the knee pain is now affecting her rest.  Grief and bereavement Significant emotional distress due to recent losses. Initial sleep difficulty improved, but low mood persists. Currently on bupropion for mood support. Discussed behavioral therapy as adjunct to medication for grief management. Emphasized continuation of medication to prevent symptom rebound. - Continue bupropion (Wellbutrin) as prescribed. - Refer to behavioral therapy for grief counseling and support.  Referral coordinator will contact for scheduling. - Refill bupropion prescription. - Contact us if still symptomatic to discuss additional options.      Relevant Medications   buPROPion (WELLBUTRIN XL) 300 MG 24 hr tablet   Other Relevant Orders   Ambulatory referral to Psychology     Orders & Medications Medications:  Meds ordered this encounter  Medications   buPROPion (WELLBUTRIN XL) 300 MG 24 hr tablet    Sig: Take 1 tablet (300 mg total) by mouth daily.    Dispense:  90 tablet    Refill:  3   Orders Placed This Encounter  Procedures   [place] LIMITED JOINT SPACE STRUCTURES LOW RIGHT   DG Knee Complete 4 Views Right   Ambulatory referral to Psychology     Return if symptoms worsen or fail to improve.     Jerrol Banana, MD, [facility]   Primary Care Sports Medicine Primary Care and Sports Medicine at [facility]

## 2023-03-31 NOTE — Assessment & Plan Note (Signed)
>>  ASSESSMENT AND PLAN FOR CHRONIC PAIN OF RIGHT KNEE WRITTEN ON 03/31/2023  1:06 PM BY Mixtli Reno J, MD  History of Present Illness Stacey Hensley is a 43 year old female who presents with right knee pain and swelling.  She has been experiencing atraumatic right knee pain and swelling since Friday. The pain is aggravated by activities such as sitting to standing, squatting, lunging, and going up and down stairs. She describes a sensation of the knee being 'stuck' and experiences pain during walking.  Some prior issues with this knee are reported.  She has not taken Celebrex  recently and is unsure if it was included in her weekly pill organizer. No medication was taken this morning. She has a history of back issues, which she feels may be aggravated by her current knee problem.  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  Physical Exam INSPECTION: No abnormalities on inspection of the right knee. PALPATION: Tenderness at the lateral and medial patellar facets, medial and lateral joint lines, and Pes Anserine bursa of the right knee. Palpable warmth present. No effusion or erythema noted. RANGE OF MOTION: Painful passive flexion of the right knee. STRENGTH: Mild discomfort with resistant right hip flexion. SPECIAL TESTS: Negative anterior and posterior drawer tests on the right knee.  Negative valgus and varus stressing, negative McMurray's.  Assessment and Plan Right knee arthralgia, acute on chronic Acute inflammation likely due to overuse or strain superimposed on an element of chronicity.  Anticipate underlying degenerative changes.  Discussed ultrasound-guided cortisone injection and agreed upon to reduce inflammation and pain. Cortisone expected to provide relief within 12 to 72 hours. Advised against overexertion post-injection and aftercare. - Administer cortisone injection to the right knee under ultrasound guidance. - Advise rest and avoidance of strenuous  activities for two days. - Instruct to apply ice to the knee for 20 minutes twice daily. - Prescribe Celebrex  100 mg as needed for pain management.  Take this up to twice daily with food until symptoms respond to cortisone. - Order x-ray of the right knee to assess for structural abnormalities. - Advise follow-up if symptoms persist or worsen in two weeks.

## 2023-04-02 ENCOUNTER — Encounter: Payer: Self-pay | Admitting: Family Medicine

## 2023-04-02 NOTE — Telephone Encounter (Signed)
Is this something you can take care of?

## 2023-04-16 ENCOUNTER — Other Ambulatory Visit: Payer: Self-pay | Admitting: Family Medicine

## 2023-04-16 ENCOUNTER — Other Ambulatory Visit: Payer: Self-pay | Admitting: Cardiology

## 2023-04-16 DIAGNOSIS — G8929 Other chronic pain: Secondary | ICD-10-CM

## 2023-04-16 DIAGNOSIS — M62830 Muscle spasm of back: Secondary | ICD-10-CM

## 2023-04-16 MED ORDER — SEMAGLUTIDE-WEIGHT MANAGEMENT 2.4 MG/0.75ML ~~LOC~~ SOAJ: 2.4000 mg | SUBCUTANEOUS | 0 refills | Status: AC

## 2023-04-17 ENCOUNTER — Telehealth: Payer: Self-pay | Admitting: Pharmacy Technician

## 2023-04-17 NOTE — Telephone Encounter (Signed)
 Pharmacy Patient Advocate Encounter   Received notification from CoverMyMeds that prior authorization for wegovy is required/requested.   Insurance verification completed.   The patient is insured through [facility] .   Per test claim: PA required; PA submitted to above mentioned insurance via CoverMyMeds Key/confirmation #/EOC [id number] Status is pending

## 2023-04-21 ENCOUNTER — Other Ambulatory Visit (HOSPITAL_COMMUNITY): Payer: Self-pay

## 2023-04-21 DIAGNOSIS — R110 Nausea: Secondary | ICD-10-CM | POA: Diagnosis not present

## 2023-04-21 DIAGNOSIS — N8301 Follicular cyst of right ovary: Secondary | ICD-10-CM | POA: Diagnosis not present

## 2023-04-21 DIAGNOSIS — K429 Umbilical hernia without obstruction or gangrene: Secondary | ICD-10-CM | POA: Diagnosis not present

## 2023-04-21 DIAGNOSIS — R42 Dizziness and giddiness: Secondary | ICD-10-CM | POA: Diagnosis not present

## 2023-04-21 DIAGNOSIS — N83201 Unspecified ovarian cyst, right side: Secondary | ICD-10-CM | POA: Diagnosis not present

## 2023-04-21 DIAGNOSIS — R059 Cough, unspecified: Secondary | ICD-10-CM | POA: Diagnosis not present

## 2023-04-21 DIAGNOSIS — N83291 Other ovarian cyst, right side: Secondary | ICD-10-CM | POA: Diagnosis not present

## 2023-04-21 DIAGNOSIS — R1030 Lower abdominal pain, unspecified: Secondary | ICD-10-CM | POA: Diagnosis not present

## 2023-04-21 DIAGNOSIS — Z20822 Contact with and (suspected) exposure to covid-19: Secondary | ICD-10-CM | POA: Diagnosis not present

## 2023-04-21 DIAGNOSIS — R1084 Generalized abdominal pain: Secondary | ICD-10-CM | POA: Diagnosis not present

## 2023-04-21 DIAGNOSIS — R1010 Upper abdominal pain, unspecified: Secondary | ICD-10-CM | POA: Diagnosis not present

## 2023-04-21 DIAGNOSIS — D259 Leiomyoma of uterus, unspecified: Secondary | ICD-10-CM | POA: Diagnosis not present

## 2023-04-21 DIAGNOSIS — R9431 Abnormal electrocardiogram [ECG] [EKG]: Secondary | ICD-10-CM | POA: Diagnosis not present

## 2023-04-21 DIAGNOSIS — E7800 Pure hypercholesterolemia, unspecified: Secondary | ICD-10-CM | POA: Diagnosis not present

## 2023-04-21 DIAGNOSIS — R10827 Generalized rebound abdominal tenderness: Secondary | ICD-10-CM | POA: Diagnosis not present

## 2023-04-21 DIAGNOSIS — I10 Essential (primary) hypertension: Secondary | ICD-10-CM | POA: Diagnosis not present

## 2023-04-21 DIAGNOSIS — R0789 Other chest pain: Secondary | ICD-10-CM | POA: Diagnosis not present

## 2023-04-21 NOTE — Telephone Encounter (Signed)
 Pharmacy Patient Advocate Encounter  Received notification from [facility] that Prior Authorization for Frederik Jansky has been APPROVED from 04/17/23 to 04/16/24. Ran test claim, Copay is $24.99- one month. This test claim was processed through [facility]- copay amounts may vary at other pharmacies due to pharmacy/plan contracts, or as the patient moves through the different stages of their insurance plan.   PA #/Case ID/Reference #: [id number]

## 2023-04-22 DIAGNOSIS — D259 Leiomyoma of uterus, unspecified: Secondary | ICD-10-CM | POA: Diagnosis not present

## 2023-04-22 DIAGNOSIS — N83291 Other ovarian cyst, right side: Secondary | ICD-10-CM | POA: Diagnosis not present

## 2023-04-22 DIAGNOSIS — N8301 Follicular cyst of right ovary: Secondary | ICD-10-CM | POA: Diagnosis not present

## 2023-04-22 DIAGNOSIS — R059 Cough, unspecified: Secondary | ICD-10-CM | POA: Diagnosis not present

## 2023-04-22 DIAGNOSIS — R1084 Generalized abdominal pain: Secondary | ICD-10-CM | POA: Diagnosis not present

## 2023-04-23 ENCOUNTER — Encounter: Payer: Self-pay | Admitting: Family Medicine

## 2023-04-23 ENCOUNTER — Ambulatory Visit: Admitting: Family Medicine

## 2023-04-23 VITALS — BP 144/86 | HR 87 | Ht 68.0 in | Wt >= 6400 oz

## 2023-04-23 DIAGNOSIS — N83201 Unspecified ovarian cyst, right side: Secondary | ICD-10-CM

## 2023-04-23 DIAGNOSIS — F418 Other specified anxiety disorders: Secondary | ICD-10-CM

## 2023-04-23 DIAGNOSIS — E662 Morbid (severe) obesity with alveolar hypoventilation: Secondary | ICD-10-CM

## 2023-04-23 DIAGNOSIS — Z6841 Body Mass Index (BMI) 40.0 and over, adult: Secondary | ICD-10-CM

## 2023-04-23 DIAGNOSIS — E66813 Obesity, class 3: Secondary | ICD-10-CM | POA: Diagnosis not present

## 2023-04-23 MED ORDER — KETOROLAC TROMETHAMINE 10 MG PO TABS
ORAL_TABLET | ORAL | 0 refills | Status: DC
Start: 2023-04-23 — End: 2023-07-07

## 2023-04-23 MED ORDER — OXYCODONE-ACETAMINOPHEN 10-325 MG PO TABS: 1.0000 | ORAL_TABLET | Freq: Four times a day (QID) | ORAL | 0 refills | Status: AC | PRN

## 2023-04-23 MED ORDER — BUPROPION HCL ER (SR) 200 MG PO TB12: 200.0000 mg | ORAL_TABLET | Freq: Two times a day (BID) | ORAL | 0 refills | Status: AC

## 2023-04-23 MED ORDER — ONDANSETRON 4 MG PO TBDP: 4.0000 mg | ORAL_TABLET | Freq: Three times a day (TID) | ORAL | 0 refills | Status: AC | PRN

## 2023-04-23 NOTE — Progress Notes (Signed)
 Primary Care / Sports Medicine Office Visit  Patient Information:  Patient ID: Stacey Hensley, female DOB: 1980-07-23 Age: 43 y.o. MRN: [id number]   Stacey Hensley is a pleasant 43 y.o. female presenting with the following:  Chief Complaint  Patient presents with   Hospitalization Follow-up    Patient following up on her recent visit to the ED. She went to ED for stomach pain. She was diagnosed with cyst on her ovary. She is not sure what ovary but her entire stomach I painful and tender to touch. She was advised to see OB or PCP. The ED doctor gave her pain medication.     Vitals:   04/23/23 0944  BP: (!) 144/86  Pulse: 87  SpO2: 100%   Vitals:   04/23/23 0944  Weight: (!) 412 lb 3.2 oz (187 kg)  Height: 5\' 8"  (1.727 m)   Body mass index is 62.67 kg/m.     Independent interpretation of notes and tests performed by another provider:   None  Procedures performed:   None  Pertinent History, Exam, Impression, and Recommendations:   Problem List Items Addressed This Visit     Class 3 obesity with alveolar hypoventilation, serious comorbidity, and body mass index (BMI) of 60.0 to 69.9 in adult [facility])   She is taking Wegovy  at a dose of 2.4 mg, which is currently paused due to her condition. Additionally, she takes bupropion  (Wellbutrin ) for mood management, which is being adjusted to a twice-daily regimen of 200 mg each.  Weight management Wegovy  paused due to potential symptom exacerbation from ovarian cyst. Cardiology coordination needed for prior authorization for next dose as their group was able to get this medication covered initially. - Pause Wegovy  until symptoms improve. - Coordinate with cardiology for Wegovy  authorization for the next dose.      Depression with anxiety   Mental health support Stress and emotional distress due to health issues. Currently on bupropion , feeling overwhelmed. Discussed mental health support and counseling  coordination. - Increase bupropion  to 200 mg BID. - Coordinate with referral for counseling. - Advise reaching out to support network.      Relevant Medications   buPROPion  (WELLBUTRIN  SR) 200 MG 12 hr tablet   Right ovarian cyst - Primary   Stacey Hensley is a 43 year old female with a history of ovarian cysts who presents for follow-up to severe lower abdominal pain necessitating visit to [facility] ER on 4/15.  She experiences severe lower abdominal pain attributed to a right ovarian cyst measuring 3.2 centimeters, confirmed by CT scan and ultrasound at [facility]. This is her second episode, with the current pain being more severe than the previous one.  Despite taking oxycodone  5 mg, she continues to experience significant pain. Morphine  and Toradol  administered in the emergency room provided temporary relief. She currently experiences chest discomfort and a 'spacey' feeling in her head, which she attributes to the medication.  Right ovarian cyst Right ovarian cyst 3.2 cm with associated significant pain. Discussed hormonal regulation via contraceptive pills or IUD with OBGYN coordination. - Refer to [facility] for evaluation and management. - Discuss contraceptive options with OBGYN. - Prescribe oral Toradol  for inflammation, closely monitor GI symptoms and start medication if any GI symptoms occur. - Prescribe Zofran  for nausea PRN. - Prescribe oxycodone  10 mg for pain, monitor for constipation. - Advise heating pads for pain relief. - Advise rest and limited activity.      Relevant Medications   ketorolac  (  TORADOL ) 10 MG tablet   oxyCODONE -acetaminophen  (PERCOCET) 10-325 MG tablet     Orders & Medications Medications:  Meds ordered this encounter  Medications   buPROPion  (WELLBUTRIN  SR) 200 MG 12 hr tablet    Sig: Take 1 tablet (200 mg total) by mouth 2 (two) times daily.    Dispense:  180 tablet    Refill:  0   ketorolac  (TORADOL ) 10 MG tablet    Sig: 20 mg once, followed by 10 mg  every 4 to 6 hours as needed; maximum daily dose: 40 mg/day; maximum duration: 5 days    Dispense:  20 tablet    Refill:  0   oxyCODONE -acetaminophen  (PERCOCET) 10-325 MG tablet    Sig: Take 1 tablet by mouth every 6 (six) hours as needed for up to 5 days for pain.    Dispense:  20 tablet    Refill:  0   ondansetron  (ZOFRAN -ODT) 4 MG disintegrating tablet    Sig: Take 1 tablet (4 mg total) by mouth every 8 (eight) hours as needed for up to 10 days for nausea or vomiting.    Dispense:  20 tablet    Refill:  0   No orders of the defined types were placed in this encounter.    Return if symptoms worsen or fail to improve.     Ma Saupe, MD, [facility]   Primary Care Sports Medicine Primary Care and Sports Medicine at [facility]

## 2023-04-25 DIAGNOSIS — N83201 Unspecified ovarian cyst, right side: Secondary | ICD-10-CM | POA: Insufficient documentation

## 2023-04-25 NOTE — Patient Instructions (Signed)
 Patient Plan for Post-Visit Guidance  1. Weight Management:    - Pause Wegovy  until symptoms from the ovarian cyst improve.    - Coordinate with cardiology for authorization of the next Wegovy  dose.  2. Depression with Anxiety:    - Increase bupropion  (Wellbutrin  SR) to 200 mg twice daily.    - Arrange a referral for counseling.    - Reach out to your support network for emotional support.  3. Right Ovarian Cyst:    - Refer to OBGYN for evaluation and management of the ovarian cyst.    - Discuss contraceptive options with your OBGYN.    - Take oral Toradol  for inflammation as prescribed; monitor for any gastrointestinal symptoms.    - Use Zofran  as needed for nausea.    - Take oxycodone  10 mg for pain; monitor for constipation.    - Use heating pads for pain relief.    - Rest and limit physical activity to manage pain.  4. Red Flags:    - Contact your healthcare provider if you experience worsening pain, severe gastrointestinal symptoms, or significant changes in mood or chest discomfort.  Please follow these steps and reach out if you have any questions or need further assistance.

## 2023-04-25 NOTE — Assessment & Plan Note (Signed)
 Mental health support Stress and emotional distress due to health issues. Currently on bupropion , feeling overwhelmed. Discussed mental health support and counseling coordination. - Increase bupropion  to 200 mg BID. - Coordinate with referral for counseling. - Advise reaching out to support network.

## 2023-04-25 NOTE — Assessment & Plan Note (Signed)
 She is taking Wegovy  at a dose of 2.4 mg, which is currently paused due to her condition. Additionally, she takes bupropion  (Wellbutrin ) for mood management, which is being adjusted to a twice-daily regimen of 200 mg each.  Weight management Wegovy  paused due to potential symptom exacerbation from ovarian cyst. Cardiology coordination needed for prior authorization for next dose as their group was able to get this medication covered initially. - Pause Wegovy  until symptoms improve. - Coordinate with cardiology for Wegovy  authorization for the next dose.

## 2023-04-25 NOTE — Assessment & Plan Note (Signed)
 Stacey Hensley is a 43 year old female with a history of ovarian cysts who presents for follow-up to severe lower abdominal pain necessitating visit to [facility] ER on 4/15.  She experiences severe lower abdominal pain attributed to a right ovarian cyst measuring 3.2 centimeters, confirmed by CT scan and ultrasound at [facility]. This is her second episode, with the current pain being more severe than the previous one.  Despite taking oxycodone  5 mg, she continues to experience significant pain. Morphine  and Toradol  administered in the emergency room provided temporary relief. She currently experiences chest discomfort and a 'spacey' feeling in her head, which she attributes to the medication.  Right ovarian cyst Right ovarian cyst 3.2 cm with associated significant pain. Discussed hormonal regulation via contraceptive pills or IUD with OBGYN coordination. - Refer to [facility] for evaluation and management. - Discuss contraceptive options with OBGYN. - Prescribe oral Toradol  for inflammation, closely monitor GI symptoms and start medication if any GI symptoms occur. - Prescribe Zofran  for nausea PRN. - Prescribe oxycodone  10 mg for pain, monitor for constipation. - Advise heating pads for pain relief. - Advise rest and limited activity.

## 2023-04-27 ENCOUNTER — Other Ambulatory Visit: Payer: Self-pay | Admitting: Family Medicine

## 2023-04-27 DIAGNOSIS — G8929 Other chronic pain: Secondary | ICD-10-CM

## 2023-04-28 NOTE — Telephone Encounter (Signed)
 Requested Prescriptions  Pending Prescriptions Disp Refills   DULoxetine  (CYMBALTA ) 60 MG capsule [Pharmacy Med Name: DULOXETINE  DR 60MG  CAPSULES] 30 capsule 1    Sig: TAKE 1 CAPSULE(60 MG) BY MOUTH DAILY     Psychiatry: Antidepressants - SNRI - duloxetine  Failed - 04/28/2023  1:44 PM      Failed - Last BP in normal range    BP Readings from Last 1 Encounters:  04/23/23 (!) 144/86         Failed - Valid encounter within last 6 months    Recent Outpatient Visits           5 days ago Right ovarian cyst   [place] [facility], Dessie Flow, MD   4 weeks ago Chronic pain of right knee   [facility], Dessie Flow, MD       Future Appointments             In 2 months Agbor-Etang, Polly Brink, MD [facility] at [facility] - Cr in normal range and within 360 days    Creatinine  Date Value Ref Range Status  10/30/2012 1.06 0.60 - 1.30 mg/dL Final   Creatinine, Ser  Date Value Ref Range Status  12/20/2022 0.90 0.57 - 1.00 mg/dL Final         Passed - eGFR is 30 or above and within 360 days    EGFR (African American)  Date Value Ref Range Status  10/30/2012 >60  Final   GFR calc Af Amer  Date Value Ref Range Status  12/31/2014 >60 >60 mL/min Final    Comment:    (NOTE) The eGFR has been calculated using the CKD EPI equation. This calculation has not been validated in all clinical situations. eGFR's persistently <60 mL/min signify possible Chronic Kidney Disease.    EGFR (Non-African Amer.)  Date Value Ref Range Status  10/30/2012 >60  Final    Comment:    eGFR values <93mL/min/1.73 m2 may be an indication of chronic kidney disease (CKD). Calculated eGFR is useful in patients with stable renal function. The eGFR calculation will not be reliable in acutely ill patients when serum creatinine is changing rapidly. It is not useful in  patients on  dialysis. The eGFR calculation may not be applicable to patients at the low and high extremes of body sizes, pregnant women, and vegetarians.    GFR calc non Af Amer  Date Value Ref Range Status  12/31/2014 >60 >60 mL/min Final   eGFR  Date Value Ref Range Status  12/20/2022 81 >59 mL/min/1.73 Final         Passed - Completed PHQ-2 or PHQ-9 in the last 360 days

## 2023-04-29 ENCOUNTER — Other Ambulatory Visit (HOSPITAL_COMMUNITY)
Admission: RE | Admit: 2023-04-29 | Discharge: 2023-04-29 | Disposition: A | Discharge status: Home / Self Care | Source: Ambulatory Visit | Attending: Obstetrics & Gynecology | Admitting: Obstetrics & Gynecology

## 2023-04-29 ENCOUNTER — Ambulatory Visit: Admitting: Obstetrics & Gynecology

## 2023-04-29 VITALS — BP 155/78 | HR 73 | Ht 69.0 in | Wt >= 6400 oz

## 2023-04-29 DIAGNOSIS — N83201 Unspecified ovarian cyst, right side: Secondary | ICD-10-CM

## 2023-04-29 DIAGNOSIS — N898 Other specified noninflammatory disorders of vagina: Secondary | ICD-10-CM | POA: Diagnosis not present

## 2023-04-29 DIAGNOSIS — Z124 Encounter for screening for malignant neoplasm of cervix: Secondary | ICD-10-CM | POA: Insufficient documentation

## 2023-04-29 MED ORDER — METRONIDAZOLE 500 MG PO TABS: 500.0000 mg | ORAL_TABLET | Freq: Two times a day (BID) | ORAL | 0 refills | Status: DC

## 2023-04-29 NOTE — Progress Notes (Signed)
    GYNECOLOGY PROGRESS NOTE  Subjective:    Patient ID: Stacey Hensley, female    DOB: [birth year], 43 y.o.   MRN: [id number]  HPI  Patient is a 43 y.o. single P0 is here for the first time in years. She is here for follow up of a 3.2 cm right ovarian cyst discovered at [facility] ED 1 week ago. She was sent home with #10 narcotic pain pills. She reports continued pain. She reports that she had a previous ovarian cyst that spontaneously resolved in the past (saw Dr. Denman Fischer at that time). 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  The following portions of the patient's history were reviewed and updated as appropriate: allergies, current medications, past family history, past medical history, past social history, past surgical history, and problem list.  Review of Systems Pertinent items are noted in HPI.  She doesn't think that she has had a pap smear in several years. She has recently lost 30 pounds with Wegovy .  Objective:   Blood pressure (!) 145/78, pulse 75, height 5\' 9"  (1.753 m), weight (!) 406 lb 14.4 oz (184.6 kg), last menstrual period 03/25/2023. Body mass index is 60.09 kg/m. Well nourished, well hydrated Black female, no apparent distress She is ambulating and conversing normally. Abd- obese, benign EG- normal Speculum exam reveals white odorous vaginal discharge  Assessment:   Right ovarian cyst- I agreed to give her a work excuse for another week I discussed preventative treatment with OCPs when/if she is no longer treated for hypertension. I encouraged weight loss. Will get follow up ultrasound Vaginal discharge- Aptima sent, flagyl  prescribed Pap smear done  Plan:    As above

## 2023-04-30 LAB — CERVICOVAGINAL ANCILLARY ONLY
Bacterial Vaginitis (gardnerella): POSITIVE — AB
Candida Glabrata: NEGATIVE
Candida Vaginitis: NEGATIVE
Chlamydia: NEGATIVE
Comment: NEGATIVE
Comment: NEGATIVE
Comment: NEGATIVE
Comment: NEGATIVE
Comment: NEGATIVE
Comment: NORMAL
Neisseria Gonorrhea: NEGATIVE
Trichomonas: POSITIVE — AB

## 2023-05-02 ENCOUNTER — Ambulatory Visit
Admission: RE | Admit: 2023-05-02 | Discharge: 2023-05-02 | Disposition: A | Discharge status: Home / Self Care | Source: Ambulatory Visit | Attending: Obstetrics & Gynecology | Admitting: Obstetrics & Gynecology

## 2023-05-02 DIAGNOSIS — N83201 Unspecified ovarian cyst, right side: Secondary | ICD-10-CM | POA: Insufficient documentation

## 2023-05-02 DIAGNOSIS — N83291 Other ovarian cyst, right side: Secondary | ICD-10-CM | POA: Diagnosis not present

## 2023-05-02 DIAGNOSIS — N888 Other specified noninflammatory disorders of cervix uteri: Secondary | ICD-10-CM | POA: Diagnosis not present

## 2023-05-02 LAB — CYTOLOGY - PAP
Comment: NEGATIVE
Diagnosis: NEGATIVE
Diagnosis: REACTIVE
High risk HPV: NEGATIVE

## 2023-05-05 ENCOUNTER — Encounter: Payer: Self-pay | Admitting: Obstetrics & Gynecology

## 2023-05-05 DIAGNOSIS — N83201 Unspecified ovarian cyst, right side: Secondary | ICD-10-CM

## 2023-05-06 NOTE — Telephone Encounter (Signed)
 Patient reached out again today, she is requesting work excuse for one more week. Her cyst has not ruptured and she continues to have pain. Please advise.

## 2023-05-07 MED ORDER — LIDOCAINE 5 % EX PTCH: 1.0000 | MEDICATED_PATCH | CUTANEOUS | 0 refills | Status: DC

## 2023-05-07 NOTE — Telephone Encounter (Signed)
 Patient calling to follow up on work note request. Placed on hold. Spoke with Dr. Everardo Hitch. U/S results given: COMPARISON:  11/17/2007.   FINDINGS: Uterusanteverted, 7 x 5 x 3 cm. The endometrium 3 mm. The uterine cavity is empty. There are no uterine masses. There are nabothian cysts.   Right ovary   Simple cyst measures 3.5 cm. No follow-up imaging recommended. Ovary measurements 3.8 x 3.6 x 3.6 cm.   Left ovary   Unremarkable, 1.7 x 1.6 x 1.3 cm.   Images of the adnexae demonstrated no masses or fluid collections. Color Doppler demonstrated ovarian blood flow.   IMPRESSION: Right ovarian simple cyst.  No pelvic pathology.   Dr. Everardo Hitch advised to send rx for lidocaine patches. Notify patient of cyst about the same size as before. 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 No follow up recommended. Patient can seek second opinion if she desires. She is not currently taking anything for the pain. Advised to take Ibuprofen 600 mg/q6h or 800 mg/q8h and use the patch.

## 2023-05-19 DIAGNOSIS — K430 Incisional hernia with obstruction, without gangrene: Secondary | ICD-10-CM | POA: Diagnosis not present

## 2023-05-19 DIAGNOSIS — I10 Essential (primary) hypertension: Secondary | ICD-10-CM | POA: Diagnosis not present

## 2023-05-19 DIAGNOSIS — K439 Ventral hernia without obstruction or gangrene: Secondary | ICD-10-CM | POA: Diagnosis not present

## 2023-05-19 DIAGNOSIS — K5669 Other partial intestinal obstruction: Secondary | ICD-10-CM | POA: Diagnosis not present

## 2023-05-19 DIAGNOSIS — Z6841 Body Mass Index (BMI) 40.0 and over, adult: Secondary | ICD-10-CM | POA: Diagnosis not present

## 2023-05-19 DIAGNOSIS — K56699 Other intestinal obstruction unspecified as to partial versus complete obstruction: Secondary | ICD-10-CM | POA: Diagnosis not present

## 2023-05-19 DIAGNOSIS — R1032 Left lower quadrant pain: Secondary | ICD-10-CM | POA: Diagnosis not present

## 2023-05-19 DIAGNOSIS — Z8719 Personal history of other diseases of the digestive system: Secondary | ICD-10-CM | POA: Diagnosis not present

## 2023-05-19 DIAGNOSIS — F32A Depression, unspecified: Secondary | ICD-10-CM | POA: Diagnosis not present

## 2023-05-19 DIAGNOSIS — K436 Other and unspecified ventral hernia with obstruction, without gangrene: Secondary | ICD-10-CM | POA: Diagnosis not present

## 2023-05-19 DIAGNOSIS — E7800 Pure hypercholesterolemia, unspecified: Secondary | ICD-10-CM | POA: Diagnosis not present

## 2023-05-19 DIAGNOSIS — R110 Nausea: Secondary | ICD-10-CM | POA: Diagnosis not present

## 2023-05-19 DIAGNOSIS — K2100 Gastro-esophageal reflux disease with esophagitis, without bleeding: Secondary | ICD-10-CM | POA: Diagnosis not present

## 2023-05-19 DIAGNOSIS — G8929 Other chronic pain: Secondary | ICD-10-CM | POA: Diagnosis not present

## 2023-05-19 DIAGNOSIS — E669 Obesity, unspecified: Secondary | ICD-10-CM | POA: Diagnosis not present

## 2023-05-26 ENCOUNTER — Other Ambulatory Visit: Payer: Self-pay | Admitting: Family Medicine

## 2023-05-26 DIAGNOSIS — G8929 Other chronic pain: Secondary | ICD-10-CM

## 2023-05-26 DIAGNOSIS — K439 Ventral hernia without obstruction or gangrene: Secondary | ICD-10-CM | POA: Diagnosis not present

## 2023-05-28 NOTE — Telephone Encounter (Signed)
 Requested Prescriptions  Pending Prescriptions Disp Refills   gabapentin  (NEURONTIN ) 300 MG capsule [Pharmacy Med Name: GABAPENTIN  300MG  CAPSULES] 30 capsule 1    Sig: TAKE 1 CAPSULE(300 MG) BY MOUTH AT BEDTIME     Neurology: Anticonvulsants - gabapentin  Failed - 05/28/2023  9:06 AM      Failed - Valid encounter within last 12 months    Recent Outpatient Visits           1 month ago Right ovarian cyst   [place] [facility], Dessie Flow, MD   1 month ago Chronic pain of right knee   [facility], Dessie Flow, MD       Future Appointments             In 1 month Constancia Delton, MD [facility] at [facility] - Cr in normal range and within 360 days    Creatinine  Date Value Ref Range Status  10/30/2012 1.06 0.60 - 1.30 mg/dL Final   Creatinine, Ser  Date Value Ref Range Status  12/20/2022 0.90 0.57 - 1.00 mg/dL Final         Passed - Completed PHQ-2 or PHQ-9 in the last 360 days

## 2023-06-09 DIAGNOSIS — K439 Ventral hernia without obstruction or gangrene: Secondary | ICD-10-CM | POA: Diagnosis not present

## 2023-06-10 ENCOUNTER — Ambulatory Visit: Admitting: Family Medicine

## 2023-06-10 ENCOUNTER — Encounter: Payer: Self-pay | Admitting: Family Medicine

## 2023-06-10 VITALS — BP 138/88 | HR 87 | Ht 69.0 in | Wt 399.2 lb

## 2023-06-10 DIAGNOSIS — R221 Localized swelling, mass and lump, neck: Secondary | ICD-10-CM | POA: Diagnosis not present

## 2023-06-10 DIAGNOSIS — Z0000 Encounter for general adult medical examination without abnormal findings: Secondary | ICD-10-CM | POA: Diagnosis not present

## 2023-06-10 DIAGNOSIS — R7309 Other abnormal glucose: Secondary | ICD-10-CM

## 2023-06-10 DIAGNOSIS — Z136 Encounter for screening for cardiovascular disorders: Secondary | ICD-10-CM

## 2023-06-10 MED ORDER — BENZONATATE 100 MG PO CAPS: 100.0000 mg | ORAL_CAPSULE | Freq: Three times a day (TID) | ORAL | 0 refills | Status: DC | PRN

## 2023-06-10 MED ORDER — PROMETHAZINE-DM 6.25-15 MG/5ML PO SYRP: 5.0000 mL | ORAL_SOLUTION | Freq: Four times a day (QID) | ORAL | 0 refills | Status: DC | PRN

## 2023-06-10 MED ORDER — AZITHROMYCIN 250 MG PO TABS: ORAL_TABLET | ORAL | 0 refills | Status: AC

## 2023-06-10 NOTE — Patient Instructions (Signed)
 Patient Action Plan  1. Cough Management:    - Take prescribed antibiotics as directed.    - Use the prescribed cough syrup and cough pearls to relieve cough.    - Take over-the-counter Mucinex and drink plenty of water.    - Start using Flonase if you have it available.    - Continue taking a daily antihistamine, such as Claritin or Zyrtec.  2. Swollen Thyroid  Gland:    - Schedule and complete a thyroid  ultrasound to evaluate the swelling.    - Complete lab work as ordered to check thyroid  function and for any infections.  Red Flags: Contact your healthcare provider if you experience difficulty breathing, severe throat swelling, or if your symptoms worsen despite following the treatment plan.

## 2023-06-10 NOTE — Progress Notes (Signed)
 Primary Care / Sports Medicine Office Visit  Patient Information:  Patient ID: Stacey Hensley, female DOB: 1980/03/12 Age: 43 y.o. MRN: [id number]   Stacey Hensley is a pleasant 43 y.o. female presenting with the following:  Chief Complaint  Patient presents with   Cough    Cough x 1 week. Patient states cough is getting worse and making her body sore from coughing so much and coughing so hard. She states coughing spell comes out f no where and she can't stop cough. She has coughed for up to 2  hours at a time.    Vitals:   06/10/23 1023  BP: 138/88  Pulse: 87  SpO2: 99%   Vitals:   06/10/23 1023  Weight: (!) 399 lb 3.2 oz (181.1 kg)  Height: 5\' 9"  (1.753 m)   Body mass index is 58.95 kg/m.  No results found.   Independent interpretation of notes and tests performed by another provider:   None  Procedures performed:   None  Pertinent History, Exam, Impression, and Recommendations:   Problem List Items Addressed This Visit     Neck swelling - Primary   History of Present Illness Stacey Hensley is a 43 year old female who presents with a persistent cough following recent bowel surgery.  She has been experiencing a sporadic cough for about a week, lasting up to two hours. The cough is non-productive of yellow sputum but is clear and was initially accompanied by a rattle.  She has been out of work since April 13 due to a cyst on her ovaries and subsequent medical issues, including recent surgery. She plans to remain out of work until July.  The cough exacerbates her abdominal pain, particularly around the surgical site, causing discomfort and soreness. She has been using a spirometer as part of her recovery. She denies any yellow sputum production and describes the cough as clear.   She has been using cough syrup with codeine, which helps her sleep but does not prevent the cough from returning. She also takes an antihistamine daily and has Flonase  available.  Physical Exam HEENT: Erythema bilateral nasal turbinates and oropharynx, no exudates, otherwise TM and canals benign. NECK: Palpable symmetrically swollen thyroid  without nodularity, shotty cervical lymphadenopathy. CHEST: Lungs are clear. ABDOMEN: Abdomen surgical site appears normal.  Assessment and Plan Cough Persistent non-productive cough likely due to postnasal drip or viral etiology, exacerbated by recent abdominal surgery. - Prescribed antibiotics for potential bacterial infection. - Prescribed cough syrup and cough pearls for symptomatic relief. - Advised use of over-the-counter Mucinex and increased water intake. - Recommended starting Flonase if available. - Continue daily antihistamine (Claritin or Zyrtec).  Swollen thyroid  gland Swelling around neck possibly related to thyroid  gland. Further investigation needed. - Order thyroid  ultrasound to assess swelling. - Order lab work to evaluate thyroid  function and check for infection.      Relevant Medications   azithromycin  (ZITHROMAX ) 250 MG tablet   benzonatate  (TESSALON  PERLES) 100 MG capsule   promethazine -dextromethorphan (PROMETHAZINE -DM) 6.25-15 MG/5ML syrup   Other Relevant Orders   CBC   TSH   T3, free   T4, free   US  THYROID    Other Visit Diagnoses       Healthcare maintenance       Relevant Orders   CBC   Comprehensive metabolic panel with GFR   Hemoglobin A1c   Lipid panel   TSH   T3, free   T4, free  Screening for cardiovascular condition       Relevant Orders   Comprehensive metabolic panel with GFR   Lipid panel     Abnormal glucose       Relevant Orders   Hemoglobin A1c        Orders & Medications Medications:  Meds ordered this encounter  Medications   azithromycin  (ZITHROMAX ) 250 MG tablet    Sig: Take 2 tablets on day 1, then 1 tablet daily on days 2 through 5    Dispense:  6 tablet    Refill:  0   benzonatate  (TESSALON  PERLES) 100 MG capsule    Sig: Take 1  capsule (100 mg total) by mouth 3 (three) times daily as needed for cough.    Dispense:  20 capsule    Refill:  0   promethazine -dextromethorphan (PROMETHAZINE -DM) 6.25-15 MG/5ML syrup    Sig: Take 5 mLs by mouth 4 (four) times daily as needed for cough.    Dispense:  118 mL    Refill:  0   Orders Placed This Encounter  Procedures   US  THYROID    CBC   Comprehensive metabolic panel with GFR   Hemoglobin A1c   Lipid panel   TSH   T3, free   T4, free     Return in about 4 weeks (around 07/08/2023) for CPE.     Ma Saupe, MD, [facility]   Primary Care Sports Medicine Primary Care and Sports Medicine at [facility]

## 2023-06-10 NOTE — Assessment & Plan Note (Addendum)
 History of Present Illness Stacey Hensley is a 43 year old female who presents with a persistent cough following recent bowel surgery.  She has been experiencing a sporadic cough for about a week, lasting up to two hours. The cough is non-productive of yellow sputum but is clear and was initially accompanied by a rattle.  She has been out of work since April 13 due to a cyst on her ovaries and subsequent medical issues, including recent surgery. She plans to remain out of work until July.  The cough exacerbates her abdominal pain, particularly around the surgical site, causing discomfort and soreness. She has been using a spirometer as part of her recovery. She denies any yellow sputum production and describes the cough as clear.   She has been using cough syrup with codeine, which helps her sleep but does not prevent the cough from returning. She also takes an antihistamine daily and has Flonase available.  Physical Exam HEENT: Erythema bilateral nasal turbinates and oropharynx, no exudates, otherwise TM and canals benign. NECK: Palpable symmetrically swollen thyroid  without nodularity, shotty cervical lymphadenopathy. CHEST: Lungs are clear. ABDOMEN: Abdomen surgical site appears normal.  Assessment and Plan Cough Persistent non-productive cough likely due to postnasal drip or viral etiology, exacerbated by recent abdominal surgery. - Prescribed antibiotics for potential bacterial infection. - Prescribed cough syrup and cough pearls for symptomatic relief. - Advised use of over-the-counter Mucinex and increased water intake. - Recommended starting Flonase if available. - Continue daily antihistamine (Claritin or Zyrtec).  Swollen thyroid  gland Swelling around neck possibly related to thyroid  gland. Further investigation needed. - Order thyroid  ultrasound to assess swelling. - Order lab work to evaluate thyroid  function and check for infection.

## 2023-06-11 LAB — COMPREHENSIVE METABOLIC PANEL WITH GFR
ALT: 21 IU/L (ref 0–32)
AST: 22 IU/L (ref 0–40)
Albumin: 3.9 g/dL (ref 3.9–4.9)
Alkaline Phosphatase: 104 IU/L (ref 44–121)
BUN/Creatinine Ratio: 10 (ref 9–23)
BUN: 9 mg/dL (ref 6–24)
Bilirubin Total: 0.3 mg/dL (ref 0.0–1.2)
CO2: 21 mmol/L (ref 20–29)
Calcium: 9 mg/dL (ref 8.7–10.2)
Chloride: 100 mmol/L (ref 96–106)
Creatinine, Ser: 0.92 mg/dL (ref 0.57–1.00)
Globulin, Total: 3.4 g/dL (ref 1.5–4.5)
Glucose: 86 mg/dL (ref 70–99)
Potassium: 4.7 mmol/L (ref 3.5–5.2)
Sodium: 138 mmol/L (ref 134–144)
Total Protein: 7.3 g/dL (ref 6.0–8.5)
eGFR: 79 mL/min/{1.73_m2} (ref >59)

## 2023-06-11 LAB — CBC
Hematocrit: 35.6 % (ref 34.0–46.6)
Hemoglobin: 11.4 g/dL (ref 11.1–15.9)
MCH: 28.2 pg (ref 26.6–33.0)
MCHC: 32 g/dL (ref 31.5–35.7)
MCV: 88 fL (ref 79–97)
Platelets: 172 10*3/uL (ref 150–450)
RBC: 4.04 x10E6/uL (ref 3.77–5.28)
RDW: 14.4 % (ref 11.7–15.4)
WBC: 6.1 10*3/uL (ref 3.4–10.8)

## 2023-06-11 LAB — TSH: TSH: 0.837 u[IU]/mL (ref 0.450–4.500)

## 2023-06-11 LAB — LIPID PANEL
Chol/HDL Ratio: 5.6 ratio — ABNORMAL HIGH (ref 0.0–4.4)
Cholesterol, Total: 219 mg/dL — ABNORMAL HIGH (ref 100–199)
HDL: 39 mg/dL — ABNORMAL LOW (ref >39)
LDL Chol Calc (NIH): 150 mg/dL — ABNORMAL HIGH (ref 0–99)
Triglycerides: 167 mg/dL — ABNORMAL HIGH (ref 0–149)
VLDL Cholesterol Cal: 30 mg/dL (ref 5–40)

## 2023-06-11 LAB — T3, FREE: T3, Free: 3.2 pg/mL (ref 2.0–4.4)

## 2023-06-11 LAB — HEMOGLOBIN A1C
Est. average glucose Bld gHb Est-mCnc: 126 mg/dL
Hgb A1c MFr Bld: 6 % — ABNORMAL HIGH (ref 4.8–5.6)

## 2023-06-11 LAB — T4, FREE: Free T4: 1.04 ng/dL (ref 0.82–1.77)

## 2023-06-13 ENCOUNTER — Ambulatory Visit
Admission: RE | Admit: 2023-06-13 | Discharge: 2023-06-13 | Disposition: A | Discharge status: Home / Self Care | Source: Ambulatory Visit | Attending: Family Medicine | Admitting: Family Medicine

## 2023-06-13 DIAGNOSIS — R221 Localized swelling, mass and lump, neck: Secondary | ICD-10-CM | POA: Insufficient documentation

## 2023-06-13 DIAGNOSIS — E010 Iodine-deficiency related diffuse (endemic) goiter: Secondary | ICD-10-CM | POA: Diagnosis not present

## 2023-06-16 ENCOUNTER — Ambulatory Visit: Payer: Self-pay | Admitting: Family Medicine

## 2023-06-16 DIAGNOSIS — E7849 Other hyperlipidemia: Secondary | ICD-10-CM

## 2023-06-16 MED ORDER — ROSUVASTATIN CALCIUM 20 MG PO TABS: 20.0000 mg | ORAL_TABLET | Freq: Every evening | ORAL | 0 refills | Status: AC

## 2023-06-24 ENCOUNTER — Other Ambulatory Visit: Payer: Self-pay | Admitting: Family Medicine

## 2023-06-24 DIAGNOSIS — G8929 Other chronic pain: Secondary | ICD-10-CM

## 2023-06-26 NOTE — Telephone Encounter (Signed)
 Requested Prescriptions  Pending Prescriptions Disp Refills   DULoxetine  (CYMBALTA ) 60 MG capsule [Pharmacy Med Name: DULOXETINE  DR 60MG  CAPSULES] 30 capsule 2    Sig: TAKE 1 CAPSULE(60 MG) BY MOUTH DAILY     Psychiatry: Antidepressants - SNRI - duloxetine  Failed - 06/26/2023 10:52 AM      Failed - Valid encounter within last 6 months    Recent Outpatient Visits           2 weeks ago Neck swelling   [place] [facility], Dessie Flow, MD   2 months ago Right ovarian cyst   [place] [facility], Dessie Flow, MD   2 months ago Chronic pain of right knee   [facility], Dessie Flow, MD       Future Appointments             In 1 week Constancia Delton, MD [facility] [place]   In 1 month Augustus Ledger Dessie Flow, MD [facility] at [facility]            Passed - Cr in normal range and within 360 days    Creatinine  Date Value Ref Range Status  10/30/2012 1.06 0.60 - 1.30 mg/dL Final   Creatinine, Ser  Date Value Ref Range Status  06/10/2023 0.92 0.57 - 1.00 mg/dL Final         Passed - eGFR is 30 or above and within 360 days    EGFR (African American)  Date Value Ref Range Status  10/30/2012 >60  Final   GFR calc Af Amer  Date Value Ref Range Status  12/31/2014 >60 >60 mL/min Final    Comment:    (NOTE) The eGFR has been calculated using the CKD EPI equation. This calculation has not been validated in all clinical situations. eGFR's persistently <60 mL/min signify possible Chronic Kidney Disease.    EGFR (Non-African Amer.)  Date Value Ref Range Status  10/30/2012 >60  Final    Comment:    eGFR values <80mL/min/1.73 m2 may be an indication of chronic kidney disease (CKD). Calculated eGFR is useful in patients with stable renal function. The eGFR calculation will  not be reliable in acutely ill patients when serum creatinine is changing rapidly. It is not useful in  patients on dialysis. The eGFR calculation may not be applicable to patients at the low and high extremes of body sizes, pregnant women, and vegetarians.    GFR calc non Af Amer  Date Value Ref Range Status  12/31/2014 >60 >60 mL/min Final   eGFR  Date Value Ref Range Status  06/10/2023 79 >59 mL/min/1.73 Final         Passed - Completed PHQ-2 or PHQ-9 in the last 360 days      Passed - Last BP in normal range    BP Readings from Last 1 Encounters:  06/10/23 138/88

## 2023-06-27 ENCOUNTER — Other Ambulatory Visit: Payer: Self-pay | Admitting: Family Medicine

## 2023-06-27 DIAGNOSIS — F418 Other specified anxiety disorders: Secondary | ICD-10-CM

## 2023-06-30 ENCOUNTER — Encounter: Payer: Self-pay | Admitting: Family Medicine

## 2023-06-30 NOTE — Telephone Encounter (Signed)
 Requested Prescriptions  Pending Prescriptions Disp Refills   buPROPion  (WELLBUTRIN  SR) 200 MG 12 hr tablet [Pharmacy Med Name: BUPROPION  SR 200MG  TABLETS (12HR)] 180 tablet 0    Sig: TAKE 1 TABLET(200 MG) BY MOUTH TWICE DAILY     Psychiatry: Antidepressants - bupropion  Failed - 06/30/2023  4:28 PM      Failed - Valid encounter within last 6 months    Recent Outpatient Visits           2 weeks ago Neck swelling   [place] [facility], Selinda PARAS, MD   2 months ago Right ovarian cyst   [place] [facility], Selinda PARAS, MD   3 months ago Chronic pain of right knee   [place] [facility], Selinda PARAS, MD       Future Appointments             In 4 days Darliss Rogue, MD [place] [facility] [place]   In 1 month Ku, Selinda PARAS, MD [facility] at [facility]            Passed - Cr in normal range and within 360 days    Creatinine  Date Value Ref Range Status  10/30/2012 1.06 0.60 - 1.30 mg/dL Final   Creatinine, Ser  Date Value Ref Range Status  06/10/2023 0.92 0.57 - 1.00 mg/dL Final         Passed - AST in normal range and within 360 days    AST  Date Value Ref Range Status  06/10/2023 22 0 - 40 IU/L Final   SGOT(AST)  Date Value Ref Range Status  10/30/2012 22 15 - 37 Unit/L Final         Passed - ALT in normal range and within 360 days    ALT  Date Value Ref Range Status  06/10/2023 21 0 - 32 IU/L Final   SGPT (ALT)  Date Value Ref Range Status  10/30/2012 21 12 - 78 U/L Final         Passed - Completed PHQ-2 or PHQ-9 in the last 360 days      Passed - Last BP in normal range    BP Readings from Last 1 Encounters:  06/10/23 138/88

## 2023-07-04 ENCOUNTER — Ambulatory Visit: Payer: BC Managed Care – PPO | Attending: Cardiology | Admitting: Cardiology

## 2023-07-04 ENCOUNTER — Encounter: Payer: Self-pay | Admitting: Cardiology

## 2023-07-04 VITALS — BP 140/80 | HR 93 | Ht 69.0 in | Wt >= 6400 oz

## 2023-07-04 DIAGNOSIS — R0602 Shortness of breath: Secondary | ICD-10-CM

## 2023-07-04 DIAGNOSIS — I10 Essential (primary) hypertension: Secondary | ICD-10-CM

## 2023-07-04 DIAGNOSIS — E782 Mixed hyperlipidemia: Secondary | ICD-10-CM

## 2023-07-04 DIAGNOSIS — Z6841 Body Mass Index (BMI) 40.0 and over, adult: Secondary | ICD-10-CM

## 2023-07-04 NOTE — Progress Notes (Signed)
 Cardiology Office Note:    Date:  07/04/2023   ID:  Stacey Hensley, DOB Oct 28, 1980, MRN [id number]  PCP:  Alvia Selinda PARAS, MD   [facility] Providers Cardiologist:  None     Referring MD: Alvia Selinda PARAS, MD   Chief Complaint  Patient presents with   Follow-up    5 month follow up pt has been doing well with no complaints of chest pain, chest pressure or SOB, medciation reviewed verbally with patient    History of Present Illness:    Stacey Hensley is a 43 y.o. female with a hx of hypertension, morbid obesity who presents for follow-up.    She is being seen due to leg edema.  Torsemide  increased to 40 mg daily.  Was previously started on Wegovy  with good effect.  However she developed a small bowel obstruction and Wegovy  was therefore stopped.  She is tolerating torsemide  as prescribed, edema is adequately controlled.  BP at home is adequately controlled, with systolic around 115-120s.  Prior notes/testing Echo 12/2022 EF 60 to 65%  Past Medical History:  Diagnosis Date   Allergy    Arthritis    Depression with anxiety 07/22/2022   Fracture of humerus, right, closed 07/22/2011   Hypertension 10/01/2021   OSA (obstructive sleep apnea) 10/01/2021    Past Surgical History:  Procedure Laterality Date   FRACTURE SURGERY Right 07/2011   RIght arm    Current Medications: Current Meds  Medication Sig   benzonatate  (TESSALON  PERLES) 100 MG capsule Take 1 capsule (100 mg total) by mouth 3 (three) times daily as needed for cough.   buPROPion  (WELLBUTRIN  SR) 200 MG 12 hr tablet Take 1 tablet (200 mg total) by mouth 2 (two) times daily.   DULoxetine  (CYMBALTA ) 60 MG capsule TAKE 1 CAPSULE(60 MG) BY MOUTH DAILY   famotidine  (PEPCID ) 20 MG tablet Take 1 tablet (20 mg total) by mouth 2 (two) times daily.   gabapentin  (NEURONTIN ) 300 MG capsule TAKE 1 CAPSULE(300 MG) BY MOUTH AT BEDTIME   lisinopril  (ZESTRIL ) 10 MG tablet Take 1 tablet (10 mg total) by mouth  daily.   mometasone  (NASONEX ) 50 MCG/ACT nasal spray One spray in each nostril twice a day, use left hand for right nostril, and right hand for left nostril.  Please dispense one bottle.   pantoprazole  (PROTONIX ) 40 MG tablet Take 1 tablet (40 mg total) by mouth daily. Take on empty stomach at least 30 minutes prior to food.   promethazine -dextromethorphan (PROMETHAZINE -DM) 6.25-15 MG/5ML syrup Take 5 mLs by mouth 4 (four) times daily as needed for cough.   rosuvastatin  (CRESTOR ) 20 MG tablet Take 1 tablet (20 mg total) by mouth every evening.   torsemide  (DEMADEX ) 20 MG tablet Take 2 tablets (40 mg total) by mouth daily.     Allergies:   Percocet [oxycodone -acetaminophen ], Tape, and Tapentadol   Social History   Socioeconomic History   Marital status: Single    Spouse name: Not on file   Number of children: Not on file   Years of education: Not on file   Highest education level: 12th grade  Occupational History   Not on file  Tobacco Use   Smoking status: Never   Smokeless tobacco: Never  Vaping Use   Vaping status: Never Used  Substance and Sexual Activity   Alcohol use: No   Drug use: No   Sexual activity: Not Currently  Other Topics Concern   Not on file  Social History Narrative  Not on file   Social Drivers of Health   Financial Resource Strain: Low Risk  (12/21/2022)   Overall Financial Resource Strain (CARDIA)    Difficulty of Paying Living Expenses: Not hard at all  Food Insecurity: No Food Insecurity (12/21/2022)   Hunger Vital Sign    Worried About Running Out of Food in the Last Year: Never true    Ran Out of Food in the Last Year: Never true  Transportation Needs: No Transportation Needs (12/21/2022)   PRAPARE - Administrator, Civil Service (Medical): No    Lack of Transportation (Non-Medical): No  Physical Activity: Insufficiently Active (12/21/2022)   Exercise Vital Sign    Days of Exercise per Week: 3 days    Minutes of Exercise per  Session: 30 min  Stress: No Stress Concern Present (12/21/2022)   [employer] of Occupational Health - Occupational Stress Questionnaire    Feeling of Stress : Not at all  Social Connections: Unknown (12/21/2022)   Social Connection and Isolation Panel    Frequency of Communication with Friends and Family: More than three times a week    Frequency of Social Gatherings with Friends and Family: Patient declined    Attends Religious Services: More than 4 times per year    Active Member of [employer] or Organizations: Yes    Attends Engineer, structural: More than 4 times per year    Marital Status: Patient declined     Family History: The patient's family history includes Arthritis in her cousin, maternal aunt, and mother; COPD in her mother; Cancer in her maternal aunt; Diabetes in her mother; Heart attack in her father and mother; Hypertension in her mother; Kidney disease in her mother; Miscarriages / Stillbirths in her mother; Stroke in her mother.  ROS:   Please see the history of present illness.     All other systems reviewed and are negative.  EKGs/Labs/Other Studies Reviewed:    The following studies were reviewed today:  EKG Interpretation Date/Time:  Friday July 04 2023 15:34:39 EDT Ventricular Rate:  93 PR Interval:  148 QRS Duration:  74 QT Interval:  362 QTC Calculation: 450 R Axis:   19  Text Interpretation: Normal sinus rhythm Normal ECG Confirmed by Darliss Rogue ([id number]) on 07/04/2023 4:25:24 PM    Recent Labs: 06/10/2023: ALT 21; BUN 9; Creatinine, Ser 0.92; Hemoglobin 11.4; Platelets 172; Potassium 4.7; Sodium 138; TSH 0.837  Recent Lipid Panel    Component Value Date/Time   CHOL 219 (H) 06/10/2023 1140   TRIG 167 (H) 06/10/2023 1140   HDL 39 (L) 06/10/2023 1140   CHOLHDL 5.6 (H) 06/10/2023 1140   LDLCALC 150 (H) 06/10/2023 1140     Risk Assessment/Calculations:         Physical Exam:    VS:  BP (!) 140/80 (BP Location: Left Arm,  Patient Position: Sitting, Cuff Size: Normal)   Pulse 93   Ht 5' 9 (1.753 m)   Wt (!) 413 lb 9.6 oz (187.6 kg)   SpO2 97%   BMI 61.08 kg/m     Wt Readings from Last 3 Encounters:  07/04/23 (!) 413 lb 9.6 oz (187.6 kg)  06/10/23 (!) 399 lb 3.2 oz (181.1 kg)  04/29/23 (!) 406 lb 14.4 oz (184.6 kg)     GEN:  Well nourished, well developed in no acute distress HEENT: Normal NECK: No JVD; No carotid bruits CARDIAC: RRR, no murmurs, rubs, gallops RESPIRATORY: Diminished breath sounds, no wheezing. ABDOMEN: Soft,  non-tender, non-distended MUSCULOSKELETAL:  1+ edema; No deformity  SKIN: Warm and dry NEUROLOGIC:  Alert and oriented x 3 PSYCHIATRIC:  Normal affect   ASSESSMENT:    1. Shortness of breath   2. Primary hypertension   3. Morbid obesity with body mass index of 60.0-69.9 in adult (HCC)   4. Mixed hyperlipidemia    PLAN:    In order of problems listed above:  Shortness of breath, echocardiogram 12/24 EF 60 to 65%. Morbid obesity likely etiology.  Leg edema well-controlled with torsemide .  Continue torsemide  40 mg daily.   Hypertension, BP elevated, well-controlled at home.  Torsemide  40 mg daily, continue lisinopril  10 mg daily.  Morbid obesity, low-calorie diet, weight loss advised.  Recent small bowel obstruction while on Wegovy .  I agree with stopping Wegovy .  Mixed hyperlipidemia, recently started on Crestor , continue Crestor  20 mg daily.  Follow-up in 6 months.    Medication Adjustments/Labs and Tests Ordered: Current medicines are reviewed at length with the patient today.  Concerns regarding medicines are outlined above.  Orders Placed This Encounter  Procedures   EKG 12-Lead   No orders of the defined types were placed in this encounter.   Patient Instructions  Medication Instructions:  Your physician recommends that you continue on your current medications as directed. Please refer to the Current Medication list given to you today.   *If you need a  refill on your cardiac medications before your next appointment, please call your pharmacy*  Lab Work: No labs ordered today  If you have labs (blood work) drawn today and your tests are completely normal, you will receive your results only by: MyChart Message (if you have MyChart) OR A paper copy in the mail If you have any lab test that is abnormal or we need to change your treatment, we will call you to review the results.  Testing/Procedures: No test ordered today   Follow-Up: At [facility], you and your health needs are our priority.  As part of our continuing mission to provide you with exceptional heart care, our providers are all part of one team.  This team includes your primary Cardiologist (physician) and Advanced Practice Providers or APPs (Physician Assistants and Nurse Practitioners) who all work together to provide you with the care you need, when you need it.  Your next appointment:   6 month(s)  Provider:   You may see Dr. Darliss or one of the following Advanced Practice Providers on your designated Care Team:   Lonni Meager, NP Lesley Maffucci, PA-C Bernardino Bring, PA-C Cadence John Sevier, PA-C Tylene Lunch, NP Barnie Hila, NP    We recommend signing up for the patient portal called MyChart.  Sign up information is provided on this After Visit Summary.  MyChart is used to connect with patients for Virtual Visits (Telemedicine).  Patients are able to view lab/test results, encounter notes, upcoming appointments, etc.  Non-urgent messages can be sent to your provider as well.   To learn more about what you can do with MyChart, go to [email or web address].         Signed, Redell Darliss, MD  07/04/2023 4:56 PM    [facility]

## 2023-07-04 NOTE — Patient Instructions (Signed)
 Medication Instructions:  Your physician recommends that you continue on your current medications as directed. Please refer to the Current Medication list given to you today.   *If you need a refill on your cardiac medications before your next appointment, please call your pharmacy*  Lab Work: No labs ordered today  If you have labs (blood work) drawn today and your tests are completely normal, you will receive your results only by: MyChart Message (if you have MyChart) OR A paper copy in the mail If you have any lab test that is abnormal or we need to change your treatment, we will call you to review the results.  Testing/Procedures: No test ordered today   Follow-Up: At [facility], you and your health needs are our priority.  As part of our continuing mission to provide you with exceptional heart care, our providers are all part of one team.  This team includes your primary Cardiologist (physician) and Advanced Practice Providers or APPs (Physician Assistants and Nurse Practitioners) who all work together to provide you with the care you need, when you need it.  Your next appointment:   6 month(s)  Provider:   You may see Dr. Darliss or one of the following Advanced Practice Providers on your designated Care Team:   Lonni Meager, NP Lesley Maffucci, PA-C Bernardino Bring, PA-C Cadence Olyphant, PA-C Tylene Lunch, NP Barnie Hila, NP    We recommend signing up for the patient portal called MyChart.  Sign up information is provided on this After Visit Summary.  MyChart is used to connect with patients for Virtual Visits (Telemedicine).  Patients are able to view lab/test results, encounter notes, upcoming appointments, etc.  Non-urgent messages can be sent to your provider as well.   To learn more about what you can do with MyChart, go to [email or web address].

## 2023-07-07 ENCOUNTER — Telehealth: Payer: Self-pay | Admitting: Family Medicine

## 2023-07-07 ENCOUNTER — Ambulatory Visit: Admitting: Family Medicine

## 2023-07-07 ENCOUNTER — Encounter: Payer: Self-pay | Admitting: Family Medicine

## 2023-07-07 VITALS — BP 128/90 | HR 80 | Ht 69.0 in | Wt >= 6400 oz

## 2023-07-07 DIAGNOSIS — K2100 Gastro-esophageal reflux disease with esophagitis, without bleeding: Secondary | ICD-10-CM

## 2023-07-07 DIAGNOSIS — K56609 Unspecified intestinal obstruction, unspecified as to partial versus complete obstruction: Secondary | ICD-10-CM | POA: Insufficient documentation

## 2023-07-07 DIAGNOSIS — M1711 Unilateral primary osteoarthritis, right knee: Secondary | ICD-10-CM

## 2023-07-07 DIAGNOSIS — M25552 Pain in left hip: Secondary | ICD-10-CM | POA: Diagnosis not present

## 2023-07-07 MED ORDER — CYCLOBENZAPRINE HCL 10 MG PO TABS: 10.0000 mg | ORAL_TABLET | Freq: Three times a day (TID) | ORAL | 0 refills | Status: AC | PRN

## 2023-07-07 NOTE — Progress Notes (Signed)
 Primary Care / Sports Medicine Office Visit  Patient Information:  Patient ID: Stacey Hensley, female DOB: 1980-01-11 Age: 43 y.o. MRN: [id number]   Stacey Hensley is a pleasant 43 y.o. female presenting with the following:  Chief Complaint  Patient presents with   Hip Pain    Left hip burning x 2 weeks. Constant burning for the last 2 weeks and nothing has been helping. Patient has tried Tylenol , flexeril , and gabapentin  but nothing is helping. No xray's.    Knee Pain    Right knee pain x 4 weeks. Patient had a cortisone injection on 03/31/23 which helped a lot. She would like to discuss possibly getting another injection today.     Vitals:   07/07/23 0925  BP: (!) 128/90  Pulse: 80  SpO2: 98%   Vitals:   07/07/23 0925  Weight: (!) 414 lb 12.8 oz (188.2 kg)  Height: 5' 9 (1.753 m)   Body mass index is 61.26 kg/m.   Independent interpretation of notes and tests performed by another provider:   None  Procedures performed:   None  Pertinent History, Exam, Impression, and Recommendations:   Problem List Items Addressed This Visit     Gastroesophageal reflux disease with esophagitis   Relevant Orders   Ambulatory referral to Gastroenterology   Greater trochanteric pain syndrome of left lower extremity - Primary   Left hip pain - Burning pain localized to the lateral aspect near the joint - Pain worsens when lying on the affected side - Unable to work since onset of hip pain  Physical Exam INSPECTION: No abnormalities to the right knee. PALPATION: Tenderness at the left lateral greater trochanter and medial joint line of the right knee. Minimal tenderness at the lateral joint line of the right knee. Positive patellar crepitus in the right knee with passive ROM. RANGE OF MOTION: Terminal flexion of right knee painful at 125 degrees. Full extension of right knee is painless. SPECIAL TESTS: Negative FADIR test on the left.  Greater trochanteric pain  syndrome Chronic left hip pain consistent with GTPS, exacerbated by lying on the affected side. Negative FADR test indicates no joint involvement. - Prescribed Celebrex  100 mg twice daily with food for one week, then as needed, monitoring for gastrointestinal side effects. - Prescribed muscle relaxant (Flexeril ) up to three times daily as needed, caution for drowsiness. - Provided exercises to stretch and loosen hip muscles, to start once inflammation reduces. - Consider corticosteroid injection if oral medications insufficient. - Follow-up in 2-4 weeks to assess progress and determine injection need.        Relevant Medications   cyclobenzaprine  (FLEXERIL ) 10 MG tablet   Primary osteoarthritis of right knee   Right knee pain and mechanical symptoms - Knee pain described as 'cutting up' - Pain onset following a previous injection in March - Associated with popping sensations and tenderness - Causes near falls - Presence of varicose veins; compression hose ordered  Knee osteoarthritis Chronic right knee pain with recent exacerbation. X-ray shows arthritis with narrowing in lateral and patellofemoral compartments. Previous corticosteroid injection provided relief. - Prescribed Celebrex  100 mg twice daily with food for one week, then as needed, monitoring for gastrointestinal side effects. - Consider corticosteroid injection if symptoms persist. - Investigate insurance coverage for gel injection for long-term management. - Follow-up in 2-4 weeks to assess progress and determine injection need.      Relevant Medications   cyclobenzaprine  (FLEXERIL ) 10 MG tablet   Small bowel  obstruction (HCC)   Gastrointestinal symptoms related to medication use - History of small bowel obstruction attributed to Wegovy  use - Symptoms included severe abdominal pain, vomiting, and changes in bowel movements  Small bowel obstruction Small bowel obstruction attributed to Wegovy , discussed mechanism of  slowed bowel motility contributing to obstruction. Advised against further use due to recurrence risk until evaluation of possible adjunct contributing factors. - Refer to GI specialist for evaluation and assessment of SBO. - Avoid Wegovy  and similar medications until GI evaluation is complete.      Relevant Orders   Ambulatory referral to Gastroenterology     Orders & Medications Medications:  Meds ordered this encounter  Medications   cyclobenzaprine  (FLEXERIL ) 10 MG tablet    Sig: Take 1 tablet (10 mg total) by mouth 3 (three) times daily as needed for muscle spasms.    Dispense:  60 tablet    Refill:  0   Orders Placed This Encounter  Procedures   Ambulatory referral to Gastroenterology     No follow-ups on file.     Stacey JINNY Ku, MD, [facility]   Primary Care Sports Medicine [facility] at [facility]

## 2023-07-07 NOTE — Assessment & Plan Note (Signed)
 Right knee pain and mechanical symptoms - Knee pain described as 'cutting up' - Pain onset following a previous injection in March - Associated with popping sensations and tenderness - Causes near falls - Presence of varicose veins; compression hose ordered  Knee osteoarthritis Chronic right knee pain with recent exacerbation. X-ray shows arthritis with narrowing in lateral and patellofemoral compartments. Previous corticosteroid injection provided relief. - Prescribed Celebrex  100 mg twice daily with food for one week, then as needed, monitoring for gastrointestinal side effects. - Consider corticosteroid injection if symptoms persist. - Investigate insurance coverage for gel injection for long-term management. - Follow-up in 2-4 weeks to assess progress and determine injection need.

## 2023-07-07 NOTE — Assessment & Plan Note (Signed)
 Left hip pain - Burning pain localized to the lateral aspect near the joint - Pain worsens when lying on the affected side - Unable to work since onset of hip pain  Physical Exam INSPECTION: No abnormalities to the right knee. PALPATION: Tenderness at the left lateral greater trochanter and medial joint line of the right knee. Minimal tenderness at the lateral joint line of the right knee. Positive patellar crepitus in the right knee with passive ROM. RANGE OF MOTION: Terminal flexion of right knee painful at 125 degrees. Full extension of right knee is painless. SPECIAL TESTS: Negative FADIR test on the left.  Greater trochanteric pain syndrome Chronic left hip pain consistent with GTPS, exacerbated by lying on the affected side. Negative FADR test indicates no joint involvement. - Prescribed Celebrex  100 mg twice daily with food for one week, then as needed, monitoring for gastrointestinal side effects. - Prescribed muscle relaxant (Flexeril ) up to three times daily as needed, caution for drowsiness. - Provided exercises to stretch and loosen hip muscles, to start once inflammation reduces. - Consider corticosteroid injection if oral medications insufficient. - Follow-up in 2-4 weeks to assess progress and determine injection need.

## 2023-07-07 NOTE — Assessment & Plan Note (Signed)
 Gastrointestinal symptoms related to medication use - History of small bowel obstruction attributed to Wegovy  use - Symptoms included severe abdominal pain, vomiting, and changes in bowel movements  Small bowel obstruction Small bowel obstruction attributed to Wegovy , discussed mechanism of slowed bowel motility contributing to obstruction. Advised against further use due to recurrence risk until evaluation of possible adjunct contributing factors. - Refer to GI specialist for evaluation and assessment of SBO. - Avoid Wegovy  and similar medications until GI evaluation is complete.

## 2023-07-07 NOTE — Patient Instructions (Signed)
 Here is the refined patient action plan based on the provided details:  Patient Action Plan  Gastroesophageal Reflux Disease: - Attend referral appointment with Gastroenterology.  Greater Trochanteric Pain Syndrome (Left Hip): - Take Celebrex  100 mg twice daily with food for one week, then as needed. Monitor for stomach issues. - Use Flexeril  up to three times daily as needed. Be cautious of drowsiness. - Start hip exercises once inflammation reduces. - Consider a corticosteroid injection if pain persists. - Follow up in 2-4 weeks to assess progress and discuss injection if needed.  Primary Osteoarthritis (Right Knee): - Take Celebrex  100 mg twice daily with food for one week, then as needed. Monitor for stomach issues. - Consider a corticosteroid injection if symptoms persist. - Check insurance coverage for gel injection for long-term management. - Follow up in 2-4 weeks to assess progress and discuss injection if needed.  Small Bowel Obstruction: - Attend referral appointment with Gastroenterology for evaluation. - Avoid Wegovy  and similar medications until after GI evaluation.  Red Flags: - If you experience any new or worsening symptoms, contact your healthcare provider immediately.

## 2023-07-08 ENCOUNTER — Other Ambulatory Visit: Payer: Self-pay

## 2023-07-09 ENCOUNTER — Encounter: Admitting: Family Medicine

## 2023-07-09 NOTE — Telephone Encounter (Signed)
 Please look through criteria and see if patient needs appt or to proceed with ordering supplement.  JM

## 2023-07-14 ENCOUNTER — Ambulatory Visit: Admitting: Family Medicine

## 2023-07-23 ENCOUNTER — Other Ambulatory Visit: Payer: Self-pay | Admitting: Family Medicine

## 2023-07-23 DIAGNOSIS — G8929 Other chronic pain: Secondary | ICD-10-CM

## 2023-07-24 NOTE — Telephone Encounter (Signed)
 Requested Prescriptions  Pending Prescriptions Disp Refills   gabapentin  (NEURONTIN ) 300 MG capsule [Pharmacy Med Name: GABAPENTIN  300MG  CAPSULES] 90 capsule 1    Sig: TAKE 1 CAPSULE(300 MG) BY MOUTH AT BEDTIME     Neurology: Anticonvulsants - gabapentin  Passed - 07/24/2023 12:07 PM      Passed - Cr in normal range and within 360 days    Creatinine  Date Value Ref Range Status  10/30/2012 1.06 0.60 - 1.30 mg/dL Final   Creatinine, Ser  Date Value Ref Range Status  06/10/2023 0.92 0.57 - 1.00 mg/dL Final         Passed - Completed PHQ-2 or PHQ-9 in the last 360 days      Passed - Valid encounter within last 12 months    Recent Outpatient Visits           2 weeks ago Greater trochanteric pain syndrome of left lower extremity   [place] [facility] Alvia, Selinda PARAS, MD   1 month ago Neck swelling   [facility] Alvia, Selinda PARAS, MD   3 months ago Right ovarian cyst   [facility] at [facility] Alvia, Selinda PARAS, MD   3 months ago Chronic pain of right knee   [facility] at [facility], Selinda PARAS, MD

## 2023-07-25 ENCOUNTER — Other Ambulatory Visit: Payer: Self-pay

## 2023-07-25 DIAGNOSIS — M1711 Unilateral primary osteoarthritis, right knee: Secondary | ICD-10-CM

## 2023-07-25 DIAGNOSIS — M25552 Pain in left hip: Secondary | ICD-10-CM

## 2023-07-25 NOTE — Telephone Encounter (Signed)
 I have placed Physical therapy order. Once she has completed PT I will submit for gel injection

## 2023-07-30 ENCOUNTER — Encounter: Admitting: Family Medicine

## 2023-07-31 ENCOUNTER — Ambulatory Visit: Admitting: Family Medicine

## 2023-08-01 ENCOUNTER — Ambulatory Visit: Admitting: Family Medicine

## 2023-08-04 ENCOUNTER — Encounter: Payer: Self-pay | Admitting: Family Medicine

## 2023-08-04 ENCOUNTER — Other Ambulatory Visit (INDEPENDENT_AMBULATORY_CARE_PROVIDER_SITE_OTHER): Payer: Self-pay | Admitting: Radiology

## 2023-08-04 ENCOUNTER — Ambulatory Visit: Admitting: Family Medicine

## 2023-08-04 VITALS — BP 126/74 | HR 80 | Ht 69.0 in | Wt >= 6400 oz

## 2023-08-04 DIAGNOSIS — M25552 Pain in left hip: Secondary | ICD-10-CM

## 2023-08-04 DIAGNOSIS — G8929 Other chronic pain: Secondary | ICD-10-CM

## 2023-08-04 DIAGNOSIS — M5441 Lumbago with sciatica, right side: Secondary | ICD-10-CM

## 2023-08-04 DIAGNOSIS — M5442 Lumbago with sciatica, left side: Secondary | ICD-10-CM | POA: Diagnosis not present

## 2023-08-04 MED ORDER — TRIAMCINOLONE ACETONIDE 40 MG/ML IJ SUSP
40.0000 mg | Freq: Once | INTRAMUSCULAR | Status: AC
  Administered 2023-08-04: 40 mg via INTRAMUSCULAR

## 2023-08-04 NOTE — Assessment & Plan Note (Signed)
 History of Present Illness Stacey Hensley is a 43 year old female with hip bursitis who presents with worsening left hip pain. She is accompanied by her goddaughter.  Left hip pain - Worsening pain localized to the left lateral hip - Pain radiates down the leg - Exacerbated by lying on the affected side - Significant nocturnal discomfort causing tossing and turning despite medication use - Persistent pain despite regular use of gabapentin  and Cymbalta , as well as Wellbutrin , muscle relaxers, and anti-inflammatory medications as needed - Engages in stretching exercises with minimal relief  Functional impact and fatigue - Returned to work approximately three weeks ago - Variable symptom severity with some days better than others - Experiences extreme fatigue after work  Physical Exam PALPATION: Focal tenderness at the left greater trochanter.  Assessment and Plan Left hip greater trochanteric pain syndrome Likely due to muscle tightness secondary to known low back issues. Discussed that cortisone injection provides temporary relief but to be used as an adjunct for rehab. - Administer corticosteroid injection to left greater trochanter under ultrasound guidance. - Refer to physical therapy in [facility] for muscle strengthening and stretching. - Continue Celebrex , muscle relaxer, gabapentin . - Ice area 20 minutes twice daily today for post-injection pain control. - Contact if no improvement in two weeks. - If persistent pain noted, focus on lumbar spine, consider imaging hip.

## 2023-08-04 NOTE — Patient Instructions (Signed)
 You have just been given a cortisone injection to reduce pain and inflammation. After the injection you may notice immediate relief of pain as a result of the Lidocaine . It is important to rest the area of the injection for 24 to 48 hours after the injection. There is a possibility of some temporary increased discomfort and swelling for up to 72 hours until the cortisone begins to work. If you do have pain, simply rest the joint and use ice. If you can tolerate over the counter medications, you can try Tylenol , Aleve, or Advil for added relief per package instructions.  Patient Plan for Post-Visit Guidance  1. Left Hip Greater Trochanteric Pain Syndrome:    - Receive a corticosteroid injection in the left greater trochanter under ultrasound guidance.    - Attend physical therapy in [facility] for muscle strengthening and stretching.    - Continue taking Celebrex , muscle relaxer, and gabapentin  as prescribed.    - Apply ice to the area for 20 minutes twice daily today for post-injection pain control.    - Contact the office if there is no improvement in two weeks.  Red Flags: - If you experience any new or worsening symptoms, contact the office immediately.

## 2023-08-04 NOTE — Progress Notes (Signed)
 Primary Care / Sports Medicine Office Visit  Patient Information:  Patient ID: Stacey Hensley, female DOB: 1980/04/18 Age: 43 y.o. MRN: [id number]   Stacey Hensley is a pleasant 43 y.o. female presenting with the following:  Chief Complaint  Patient presents with   Hip Pain    Left hip pain continues. Patient having trouble sleeping do to the pain. She is wanting to discuss injection.     Vitals:   08/04/23 1015  BP: 126/74  Pulse: 80  SpO2: 99%   Vitals:   08/04/23 1015  Weight: (!) 419 lb (190.1 kg)  Height: 5' 9 (1.753 m)   Body mass index is 61.88 kg/m.  No results found.   Independent interpretation of notes and tests performed by another provider:   None  Procedures performed:   Procedure:  Injection of left hip under ultrasound guidance. Ultrasound guidance utilized for out-of-plane approach to greater trochanteric region, no hypoechoic region consistent with bursitis noted Samsung HS60 device utilized with permanent recording / reporting. Verbal informed consent obtained and verified. Skin prepped in a sterile fashion. Ethyl chloride for topical local analgesia.  Completed without difficulty and tolerated well. Medication: triamcinolone  acetonide 40 mg/mL suspension for injection 1 mL total and 2 mL lidocaine  1% without epinephrine utilized for needle placement anesthetic Advised to contact for fevers/chills, erythema, induration, drainage, or persistent bleeding.   Pertinent History, Exam, Impression, and Recommendations:   Problem List Items Addressed This Visit     Chronic bilateral low back pain with bilateral sciatica   Relevant Orders   Ambulatory referral to Physical Therapy   Greater trochanteric pain syndrome of left lower extremity - Primary   History of Present Illness Stacey Hensley is a 43 year old female with hip bursitis who presents with worsening left hip pain. She is accompanied by her goddaughter.  Left hip pain -  Worsening pain localized to the left lateral hip - Pain radiates down the leg - Exacerbated by lying on the affected side - Significant nocturnal discomfort causing tossing and turning despite medication use - Persistent pain despite regular use of gabapentin  and Cymbalta , as well as Wellbutrin , muscle relaxers, and anti-inflammatory medications as needed - Engages in stretching exercises with minimal relief  Functional impact and fatigue - Returned to work approximately three weeks ago - Variable symptom severity with some days better than others - Experiences extreme fatigue after work  Physical Exam PALPATION: Focal tenderness at the left greater trochanter.  Assessment and Plan Left hip greater trochanteric pain syndrome Likely due to muscle tightness secondary to known low back issues. Discussed that cortisone injection provides temporary relief but to be used as an adjunct for rehab. - Administer corticosteroid injection to left greater trochanter under ultrasound guidance. - Refer to physical therapy in [facility] for muscle strengthening and stretching. - Continue Celebrex , muscle relaxer, gabapentin . - Ice area 20 minutes twice daily today for post-injection pain control. - Contact if no improvement in two weeks. - If persistent pain noted, focus on lumbar spine, consider imaging hip.      Relevant Orders   US  LIMITED JOINT SPACE STRUCTURES LOW LEFT   Ambulatory referral to Physical Therapy     Orders & Medications Medications:  Meds ordered this encounter  Medications   triamcinolone  acetonide (KENALOG -40) injection 40 mg   Orders Placed This Encounter  Procedures   US  LIMITED JOINT SPACE STRUCTURES LOW LEFT   Ambulatory referral to Physical Therapy  No follow-ups on file.     Selinda JINNY Ku, MD, [facility]   Primary Care Sports Medicine [facility] at [facility]

## 2023-08-19 ENCOUNTER — Other Ambulatory Visit: Payer: Self-pay | Admitting: Family Medicine

## 2023-08-19 DIAGNOSIS — M25552 Pain in left hip: Secondary | ICD-10-CM | POA: Diagnosis not present

## 2023-08-19 DIAGNOSIS — E7849 Other hyperlipidemia: Secondary | ICD-10-CM

## 2023-08-21 DIAGNOSIS — M25552 Pain in left hip: Secondary | ICD-10-CM | POA: Diagnosis not present

## 2023-08-26 DIAGNOSIS — M25552 Pain in left hip: Secondary | ICD-10-CM | POA: Diagnosis not present

## 2023-08-28 DIAGNOSIS — M25552 Pain in left hip: Secondary | ICD-10-CM | POA: Diagnosis not present

## 2023-09-09 DIAGNOSIS — M25552 Pain in left hip: Secondary | ICD-10-CM | POA: Diagnosis not present

## 2023-09-12 DIAGNOSIS — M25552 Pain in left hip: Secondary | ICD-10-CM | POA: Diagnosis not present

## 2023-09-16 DIAGNOSIS — M25552 Pain in left hip: Secondary | ICD-10-CM | POA: Diagnosis not present

## 2023-09-18 DIAGNOSIS — M25552 Pain in left hip: Secondary | ICD-10-CM | POA: Diagnosis not present

## 2023-09-25 DIAGNOSIS — M25552 Pain in left hip: Secondary | ICD-10-CM | POA: Diagnosis not present

## 2023-10-08 DIAGNOSIS — K219 Gastro-esophageal reflux disease without esophagitis: Secondary | ICD-10-CM | POA: Diagnosis not present

## 2023-10-08 DIAGNOSIS — K625 Hemorrhage of anus and rectum: Secondary | ICD-10-CM | POA: Diagnosis not present

## 2023-10-08 DIAGNOSIS — R1314 Dysphagia, pharyngoesophageal phase: Secondary | ICD-10-CM | POA: Diagnosis not present

## 2023-10-14 ENCOUNTER — Ambulatory Visit
Admission: RE | Admit: 2023-10-14 | Discharge: 2023-10-14 | Disposition: A | Discharge status: Home / Self Care | Attending: Gastroenterology | Admitting: Gastroenterology

## 2023-10-14 ENCOUNTER — Ambulatory Visit: Payer: Self-pay

## 2023-10-14 ENCOUNTER — Encounter
Admission: RE | Disposition: A | Discharge status: Home / Self Care | Payer: Self-pay | Source: Home / Self Care | Attending: Gastroenterology

## 2023-10-14 DIAGNOSIS — K3189 Other diseases of stomach and duodenum: Secondary | ICD-10-CM | POA: Diagnosis not present

## 2023-10-14 DIAGNOSIS — F32A Depression, unspecified: Secondary | ICD-10-CM | POA: Diagnosis not present

## 2023-10-14 DIAGNOSIS — K2950 Unspecified chronic gastritis without bleeding: Secondary | ICD-10-CM | POA: Insufficient documentation

## 2023-10-14 DIAGNOSIS — K219 Gastro-esophageal reflux disease without esophagitis: Secondary | ICD-10-CM | POA: Diagnosis not present

## 2023-10-14 DIAGNOSIS — G4733 Obstructive sleep apnea (adult) (pediatric): Secondary | ICD-10-CM | POA: Diagnosis not present

## 2023-10-14 DIAGNOSIS — K5730 Diverticulosis of large intestine without perforation or abscess without bleeding: Secondary | ICD-10-CM | POA: Diagnosis not present

## 2023-10-14 DIAGNOSIS — K625 Hemorrhage of anus and rectum: Secondary | ICD-10-CM | POA: Insufficient documentation

## 2023-10-14 DIAGNOSIS — K640 First degree hemorrhoids: Secondary | ICD-10-CM | POA: Diagnosis not present

## 2023-10-14 DIAGNOSIS — Z79899 Other long term (current) drug therapy: Secondary | ICD-10-CM | POA: Diagnosis not present

## 2023-10-14 DIAGNOSIS — R1310 Dysphagia, unspecified: Secondary | ICD-10-CM | POA: Insufficient documentation

## 2023-10-14 DIAGNOSIS — K5790 Diverticulosis of intestine, part unspecified, without perforation or abscess without bleeding: Secondary | ICD-10-CM | POA: Diagnosis not present

## 2023-10-14 DIAGNOSIS — K2970 Gastritis, unspecified, without bleeding: Secondary | ICD-10-CM | POA: Diagnosis not present

## 2023-10-14 DIAGNOSIS — I10 Essential (primary) hypertension: Secondary | ICD-10-CM | POA: Insufficient documentation

## 2023-10-14 DIAGNOSIS — F419 Anxiety disorder, unspecified: Secondary | ICD-10-CM | POA: Insufficient documentation

## 2023-10-14 DIAGNOSIS — K2100 Gastro-esophageal reflux disease with esophagitis, without bleeding: Secondary | ICD-10-CM | POA: Insufficient documentation

## 2023-10-14 DIAGNOSIS — K436 Other and unspecified ventral hernia with obstruction, without gangrene: Secondary | ICD-10-CM | POA: Insufficient documentation

## 2023-10-14 HISTORY — PX: ESOPHAGOGASTRODUODENOSCOPY: SHX5428

## 2023-10-14 HISTORY — DX: Gastro-esophageal reflux disease without esophagitis: K21.9

## 2023-10-14 HISTORY — PX: COLONOSCOPY: SHX5424

## 2023-10-14 SURGERY — COLONOSCOPY
Anesthesia: General

## 2023-10-14 MED ORDER — SODIUM CHLORIDE 0.9 % IV SOLN: INTRAVENOUS | Status: DC

## 2023-10-14 MED ORDER — LIDOCAINE HCL (CARDIAC) PF 100 MG/5ML IV SOSY
PREFILLED_SYRINGE | INTRAVENOUS | Status: DC | PRN
  Administered 2023-10-14: 100 mg via INTRAVENOUS

## 2023-10-14 MED ORDER — PROPOFOL 10 MG/ML IV BOLUS
INTRAVENOUS | Status: DC | PRN
  Administered 2023-10-14 (×2): 40 mg via INTRAVENOUS
  Administered 2023-10-14: 20 mg via INTRAVENOUS

## 2023-10-14 MED ORDER — GLYCOPYRROLATE 0.2 MG/ML IJ SOLN
INTRAMUSCULAR | Status: DC | PRN
  Administered 2023-10-14: .2 mg via INTRAVENOUS

## 2023-10-14 MED ORDER — DEXMEDETOMIDINE HCL IN NACL 80 MCG/20ML IV SOLN
INTRAVENOUS | Status: DC | PRN
  Administered 2023-10-14: 12 ug via INTRAVENOUS
  Administered 2023-10-14: 8 ug via INTRAVENOUS

## 2023-10-14 MED ORDER — PROPOFOL 500 MG/50ML IV EMUL
INTRAVENOUS | Status: DC | PRN
  Administered 2023-10-14: 50 ug/kg/min via INTRAVENOUS

## 2023-10-14 NOTE — Interval H&P Note (Signed)
 History and Physical Interval Note:  10/14/2023 10:35 AM  Stacey Hensley  has presented today for surgery, with the diagnosis of GERD Rectal Bleeding Dysphagia.  The various methods of treatment have been discussed with the patient and family. After consideration of risks, benefits and other options for treatment, the patient has consented to  Procedure(s): COLONOSCOPY (N/A) EGD (ESOPHAGOGASTRODUODENOSCOPY) (N/A) as a surgical intervention.  The patient's history has been reviewed, patient examined, no change in status, stable for surgery.  I have reviewed the patient's chart and labs.  Questions were answered to the patient's satisfaction.     Ole ONEIDA Schick  Ok to proceed with EGD/Colonoscopy

## 2023-10-14 NOTE — Op Note (Signed)
 [facility] Gastroenterology Patient Name: Stacey Hensley Procedure Date: 10/14/2023 10:39 AM MRN: [id number] Account #: [id number] Date of Birth: February 10, 1980 Admit Type: Outpatient Age: 43 Room: [facility] ENDO ROOM 1 Gender: Female Note Status: Finalized Instrument Name: Upper GI Scope [id number] Procedure:             Upper GI endoscopy Indications:           Dysphagia, Gastro-esophageal reflux disease Providers:             Ole Schick MD, MD Referring MD:          No Local Md, MD (Referring MD) Medicines:             Monitored Anesthesia Care Complications:         No immediate complications. Estimated blood loss:                         Minimal. Procedure:             Pre-Anesthesia Assessment:                        - Prior to the procedure, a History and Physical was                         performed, and patient medications and allergies were                         reviewed. The patient is competent. The risks and                         benefits of the procedure and the sedation options and                         risks were discussed with the patient. All questions                         were answered and informed consent was obtained.                         Patient identification and proposed procedure were                         verified by the physician, the nurse, the                         anesthesiologist, the anesthetist and the technician                         in the endoscopy suite. Mental Status Examination:                         alert and oriented. Airway Examination: normal                         oropharyngeal airway and neck mobility. Respiratory                         Examination: clear to auscultation. CV Examination:  normal. Prophylactic Antibiotics: The patient does not                         require prophylactic antibiotics. Prior                         Anticoagulants: The patient has taken no  anticoagulant                         or antiplatelet agents. ASA Grade Assessment: III - A                         patient with severe systemic disease. After reviewing                         the risks and benefits, the patient was deemed in                         satisfactory condition to undergo the procedure. The                         anesthesia plan was to use monitored anesthesia care                         (MAC). Immediately prior to administration of                         medications, the patient was re-assessed for adequacy                         to receive sedatives. The heart rate, respiratory                         rate, oxygen saturations, blood pressure, adequacy of                         pulmonary ventilation, and response to care were                         monitored throughout the procedure. The physical                         status of the patient was re-assessed after the                         procedure.                        After obtaining informed consent, the endoscope was                         passed under direct vision. Throughout the procedure,                         the patient's blood pressure, pulse, and oxygen                         saturations were monitored continuously. The Endoscope  was introduced through the mouth, and advanced to the                         second part of duodenum. The upper GI endoscopy was                         accomplished without difficulty. The patient tolerated                         the procedure well. Findings:      The examined esophagus was normal.      Patchy minimal inflammation characterized by erythema was found in the       gastric antrum. Biopsies were taken with a cold forceps for Helicobacter       pylori testing. Estimated blood loss was minimal.      The examined duodenum was normal. Impression:            - Normal esophagus.                        - Gastritis.  Biopsied.                        - Normal examined duodenum. Recommendation:        - Discharge patient to home.                        - Resume previous diet.                        - Continue present medications.                        - Await pathology results.                        - Return to referring physician as previously                         scheduled. Procedure Code(s):     --- Professional ---                        [phone], Esophagogastroduodenoscopy, flexible,                         transoral; with biopsy, single or multiple Diagnosis Code(s):     --- Professional ---                        K29.70, Gastritis, unspecified, without bleeding                        R13.10, Dysphagia, unspecified                        K21.9, Gastro-esophageal reflux disease without                         esophagitis CPT copyright 2022 American Medical Association. All rights reserved. The codes documented in this report are preliminary and upon coder review may  be revised to meet current compliance requirements. Ole Schick MD, MD 10/14/2023 11:15:37  AM Number of Addenda: 0 Note Initiated On: 10/14/2023 10:39 AM Estimated Blood Loss:  Estimated blood loss was minimal.      [facility]

## 2023-10-14 NOTE — Transfer of Care (Signed)
 Immediate Anesthesia Transfer of Care Note  Patient: Stacey Hensley  Procedure(s) Performed: COLONOSCOPY EGD (ESOPHAGOGASTRODUODENOSCOPY)  Patient Location: PACU  Anesthesia Type:General  Level of Consciousness: sedated  Airway & Oxygen Therapy: Patient Spontanous Breathing  Post-op Assessment: Report given to RN and Post -op Vital signs reviewed and stable  Post vital signs: Reviewed and stable  Last Vitals:  Vitals Value Taken Time  BP 106/55 10/14/23 11:08  Temp 35.9 C 10/14/23 11:08  Pulse 82 10/14/23 11:09  Resp 24 10/14/23 11:09  SpO2 100 % 10/14/23 11:09  Vitals shown include unfiled device data.  Last Pain:  Vitals:   10/14/23 1108  TempSrc: Temporal  PainSc: 0-No pain         Complications: No notable events documented.

## 2023-10-14 NOTE — H&P (Signed)
 Outpatient short stay form Pre-procedure 10/14/2023  Stacey Hensley Schick, MD  Primary Physician: Alvia Selinda PARAS, MD  Reason for visit:  History of sbo, rectal bleeding, GERD, dysphagia  History of present illness:    43 y/o lady with morbid obesity here for EGD/Colonoscopy for above symptoms. No blood thinners. Had ventral hernia repair for incarcerated ventral hernia. No first degree relatives with GI malignancies.    Current Facility-Administered Medications:    0.9 %  sodium chloride infusion, , Intravenous, Continuous, Alycen Mack, Stacey ONEIDA, MD, Last Rate: 20 mL/hr at 10/14/23 0953, New Bag at 10/14/23 0953  Medications Prior to Admission  Medication Sig Dispense Refill Last Dose/Taking   lisinopril  (ZESTRIL ) 10 MG tablet Take 1 tablet (10 mg total) by mouth daily. 90 tablet 3 10/13/2023   torsemide  (DEMADEX ) 20 MG tablet Take 2 tablets (40 mg total) by mouth daily. 180 tablet 0 10/13/2023   buPROPion  (WELLBUTRIN  SR) 200 MG 12 hr tablet Take 1 tablet (200 mg total) by mouth 2 (two) times daily. 180 tablet 0    celecoxib  (CELEBREX ) 100 MG capsule TAKE 1 CAPSULE(100 MG) BY MOUTH TWICE DAILY AS NEEDED 50 capsule 0    cyclobenzaprine  (FLEXERIL ) 10 MG tablet Take 1 tablet (10 mg total) by mouth 3 (three) times daily as needed for muscle spasms. 60 tablet 0    DULoxetine  (CYMBALTA ) 60 MG capsule TAKE 1 CAPSULE(60 MG) BY MOUTH DAILY 30 capsule 2    famotidine  (PEPCID ) 20 MG tablet Take 1 tablet (20 mg total) by mouth 2 (two) times daily. 60 tablet 2    gabapentin  (NEURONTIN ) 300 MG capsule TAKE 1 CAPSULE(300 MG) BY MOUTH AT BEDTIME 90 capsule 1    mometasone  (NASONEX ) 50 MCG/ACT nasal spray One spray in each nostril twice a day, use left hand for right nostril, and right hand for left nostril.  Please dispense one bottle. 1 g 6    rosuvastatin  (CRESTOR ) 20 MG tablet Take 1 tablet (20 mg total) by mouth every evening. 90 tablet 0      Allergies  Allergen Reactions   Tape Other (See  Comments)    Patient states this welps her skin.   Tapentadol Other (See Comments)    Patient states this welps her skin.     Past Medical History:  Diagnosis Date   Allergy    Arthritis    Depression with anxiety 07/22/2022   Fracture of humerus, right, closed 07/22/2011   GERD (gastroesophageal reflux disease)    Hypertension 10/01/2021   OSA (obstructive sleep apnea) 10/01/2021    Review of systems:  Otherwise negative.    Physical Exam  Gen: Alert, oriented. Appears stated age.  HEENT: PERRLA. Lungs: No respiratory distress CV: RRR Abd: soft, benign, no masses Ext: No edema    Planned procedures: Proceed with EGD/colonoscopy. The patient understands the nature of the planned procedure, indications, risks, alternatives and potential complications including but not limited to bleeding, infection, perforation, damage to internal organs and possible oversedation/side effects from anesthesia. The patient agrees and gives consent to proceed.  Please refer to procedure notes for findings, recommendations and patient disposition/instructions.     Stacey Hensley Schick, MD [facility] Gastroenterology

## 2023-10-14 NOTE — OR Nursing (Signed)
 Patient had difficulty urinating. Stated she does not have sexual relations.

## 2023-10-14 NOTE — Op Note (Signed)
 [facility] Patient Name: Stacey Hensley Procedure Date: 10/14/2023 10:38 AM MRN: [id number] Account #: [id number] Date of Birth: 10/31/1980 Admit Type: Outpatient Age: 43 Room: [facility] ENDO ROOM 1 Gender: Female Note Status: Finalized Instrument Name: Colon Scope [id number] Procedure:             Colonoscopy Indications:           Rectal bleeding Providers:             Ole Schick MD, MD Referring MD:          No Local Md, MD (Referring MD) Medicines:             Monitored Anesthesia Care Complications:         No immediate complications. Procedure:             Pre-Anesthesia Assessment:                        - Prior to the procedure, a History and Physical was                         performed, and patient medications and allergies were                         reviewed. The patient is competent. The risks and                         benefits of the procedure and the sedation options and                         risks were discussed with the patient. All questions                         were answered and informed consent was obtained.                         Patient identification and proposed procedure were                         verified by the physician, the nurse, the                         anesthesiologist, the anesthetist and the technician                         in the endoscopy suite. Mental Status Examination:                         alert and oriented. Airway Examination: normal                         oropharyngeal airway and neck mobility. Respiratory                         Examination: clear to auscultation. CV Examination:                         normal. Prophylactic Antibiotics: The patient does not  require prophylactic antibiotics. Prior                         Anticoagulants: The patient has taken no anticoagulant                         or antiplatelet agents. ASA Grade Assessment: III - A                          patient with severe systemic disease. After reviewing                         the risks and benefits, the patient was deemed in                         satisfactory condition to undergo the procedure. The                         anesthesia plan was to use monitored anesthesia care                         (MAC). Immediately prior to administration of                         medications, the patient was re-assessed for adequacy                         to receive sedatives. The heart rate, respiratory                         rate, oxygen saturations, blood pressure, adequacy of                         pulmonary ventilation, and response to care were                         monitored throughout the procedure. The physical                         status of the patient was re-assessed after the                         procedure.                        After obtaining informed consent, the colonoscope was                         passed under direct vision. Throughout the procedure,                         the patient's blood pressure, pulse, and oxygen                         saturations were monitored continuously. The                         Colonoscope was introduced through the anus and  advanced to the the terminal ileum, with                         identification of the appendiceal orifice and IC                         valve. The colonoscopy was performed without                         difficulty. The patient tolerated the procedure well.                         The quality of the bowel preparation was good. The                         terminal ileum, ileocecal valve, appendiceal orifice,                         and rectum were photographed. Findings:      The perianal and digital rectal examinations were normal.      The terminal ileum appeared normal.      A few small-mouthed diverticula were found in the sigmoid colon.      Internal hemorrhoids  were found during retroflexion. The hemorrhoids       were Grade I (internal hemorrhoids that do not prolapse).      The exam was otherwise without abnormality on direct and retroflexion       views. Impression:            - The examined portion of the ileum was normal.                        - Diverticulosis in the sigmoid colon.                        - Internal hemorrhoids.                        - The examination was otherwise normal on direct and                         retroflexion views.                        - No specimens collected. Recommendation:        - Discharge patient to home.                        - Resume previous diet.                        - Continue present medications.                        - Repeat colonoscopy in 10 years for screening                         purposes.                        - Return to referring physician as previously  scheduled. Procedure Code(s):     --- Professional ---                        [phone], Colonoscopy, flexible; diagnostic, including                         collection of specimen(s) by brushing or washing, when                         performed (separate procedure) Diagnosis Code(s):     --- Professional ---                        K64.0, First degree hemorrhoids                        K62.5, Hemorrhage of anus and rectum                        K57.30, Diverticulosis of large intestine without                         perforation or abscess without bleeding CPT copyright 2022 [employer]. All rights reserved. The codes documented in this report are preliminary and upon coder review may  be revised to meet current compliance requirements. Ole Schick MD, MD 10/14/2023 11:19:02 AM Number of Addenda: 0 Note Initiated On: 10/14/2023 10:38 AM Estimated Blood Loss:  Estimated blood loss: none.      [facility]

## 2023-10-14 NOTE — Anesthesia Preprocedure Evaluation (Addendum)
 Anesthesia Evaluation  Patient identified by MRN, date of birth, ID band Patient awake    Reviewed: Allergy & Precautions, NPO status , Patient's Chart, lab work & pertinent test results  Airway Mallampati: III  TM Distance: >3 FB Neck ROM: full    Dental  (+) Chipped   Pulmonary sleep apnea and Continuous Positive Airway Pressure Ventilation    Pulmonary exam normal        Cardiovascular hypertension, On Medications Normal cardiovascular exam     Neuro/Psych  PSYCHIATRIC DISORDERS Anxiety Depression    negative neurological ROS     GI/Hepatic Neg liver ROS,GERD  Medicated and Controlled,,  Endo/Other    Class 4 obesity  Renal/GU negative Renal ROS  negative genitourinary   Musculoskeletal   Abdominal   Peds  Hematology negative hematology ROS (+)   Anesthesia Other Findings Past Medical History: No date: Allergy No date: Arthritis 07/22/2022: Depression with anxiety 07/22/2011: Fracture of humerus, right, closed No date: GERD (gastroesophageal reflux disease) 10/01/2021: Hypertension 10/01/2021: OSA (obstructive sleep apnea)  Past Surgical History: 07/2011: FRACTURE SURGERY; Right     Comment:  RIght arm  BMI    Body Mass Index: 62.02 kg/m      Reproductive/Obstetrics negative OB ROS                              Anesthesia Physical Anesthesia Plan  ASA: 3  Anesthesia Plan: General   Post-op Pain Management:    Induction: Intravenous  PONV Risk Score and Plan: Propofol infusion and TIVA  Airway Management Planned: Nasal Cannula  Additional Equipment: None  Intra-op Plan:   Post-operative Plan:   Informed Consent: I have reviewed the patients History and Physical, chart, labs and discussed the procedure including the risks, benefits and alternatives for the proposed anesthesia with the patient or authorized representative who has indicated his/her understanding  and acceptance.     Dental Advisory Given  Plan Discussed with: Anesthesiologist, CRNA and Surgeon  Anesthesia Plan Comments: (Discussed risks of anesthesia with patient, including possibility of difficulty with spontaneous ventilation under anesthesia necessitating airway intervention, PONV, and rare risks such as cardiac or respiratory or neurological events, and allergic reactions. Discussed the role of CRNA in patient's perioperative care. Patient understands.)         Anesthesia Quick Evaluation

## 2023-10-14 NOTE — Anesthesia Postprocedure Evaluation (Signed)
 Anesthesia Post Note  Patient: Stacey Hensley  Procedure(s) Performed: COLONOSCOPY EGD (ESOPHAGOGASTRODUODENOSCOPY)  Patient location during evaluation: Endoscopy Anesthesia Type: General Level of consciousness: awake and alert Pain management: pain level controlled Vital Signs Assessment: post-procedure vital signs reviewed and stable Respiratory status: spontaneous breathing, nonlabored ventilation, respiratory function stable and patient connected to nasal cannula oxygen Cardiovascular status: blood pressure returned to baseline and stable Postop Assessment: no apparent nausea or vomiting Anesthetic complications: no   No notable events documented.   Last Vitals:  Vitals:   10/14/23 1117 10/14/23 1127  BP: 126/75 (!) 119/94  Pulse: 74 74  Resp: (!) 23 16  Temp: (!) 36 C (!) 36 C  SpO2: 100% 100%    Last Pain:  Vitals:   10/14/23 1127  TempSrc: Temporal  PainSc: 0-No pain                 Debby Mines

## 2023-10-15 LAB — SURGICAL PATHOLOGY

## 2023-11-10 DIAGNOSIS — F4323 Adjustment disorder with mixed anxiety and depressed mood: Secondary | ICD-10-CM | POA: Diagnosis not present

## 2023-11-10 DIAGNOSIS — F331 Major depressive disorder, recurrent, moderate: Secondary | ICD-10-CM | POA: Diagnosis not present

## 2023-11-17 DIAGNOSIS — F331 Major depressive disorder, recurrent, moderate: Secondary | ICD-10-CM | POA: Diagnosis not present

## 2023-11-17 DIAGNOSIS — F4323 Adjustment disorder with mixed anxiety and depressed mood: Secondary | ICD-10-CM | POA: Diagnosis not present

## 2023-11-24 DIAGNOSIS — F331 Major depressive disorder, recurrent, moderate: Secondary | ICD-10-CM | POA: Diagnosis not present

## 2023-12-08 DIAGNOSIS — F331 Major depressive disorder, recurrent, moderate: Secondary | ICD-10-CM | POA: Diagnosis not present

## 2023-12-08 DIAGNOSIS — F4323 Adjustment disorder with mixed anxiety and depressed mood: Secondary | ICD-10-CM | POA: Diagnosis not present

## 2023-12-19 DIAGNOSIS — F331 Major depressive disorder, recurrent, moderate: Secondary | ICD-10-CM | POA: Diagnosis not present

## 2023-12-19 DIAGNOSIS — F4323 Adjustment disorder with mixed anxiety and depressed mood: Secondary | ICD-10-CM | POA: Diagnosis not present

## 2023-12-27 DIAGNOSIS — F331 Major depressive disorder, recurrent, moderate: Secondary | ICD-10-CM | POA: Diagnosis not present

## 2023-12-27 DIAGNOSIS — F4323 Adjustment disorder with mixed anxiety and depressed mood: Secondary | ICD-10-CM | POA: Diagnosis not present

## 2024-01-05 DIAGNOSIS — F4323 Adjustment disorder with mixed anxiety and depressed mood: Secondary | ICD-10-CM | POA: Diagnosis not present

## 2024-01-05 DIAGNOSIS — F331 Major depressive disorder, recurrent, moderate: Secondary | ICD-10-CM | POA: Diagnosis not present

## 2024-01-06 ENCOUNTER — Telehealth: Admitting: Physician Assistant

## 2024-01-06 DIAGNOSIS — J069 Acute upper respiratory infection, unspecified: Secondary | ICD-10-CM | POA: Diagnosis not present

## 2024-01-07 MED ORDER — IPRATROPIUM BROMIDE 0.03 % NA SOLN: 2.0000 | Freq: Two times a day (BID) | NASAL | 0 refills | Status: AC

## 2024-01-07 MED ORDER — BENZONATATE 100 MG PO CAPS: 100.0000 mg | ORAL_CAPSULE | Freq: Three times a day (TID) | ORAL | 0 refills | Status: AC | PRN

## 2024-01-07 MED ORDER — LIDOCAINE VISCOUS HCL 2 % MT SOLN: 5.0000 mL | Freq: Four times a day (QID) | OROMUCOSAL | 0 refills | Status: AC | PRN

## 2024-01-07 NOTE — Progress Notes (Signed)

## 2024-01-14 NOTE — Progress Notes (Unsigned)
 "  Cardiology Clinic Note   Date: 01/14/2024 ID: KRIPA FOSKEY, DOB 13-Sep-1980, MRN [id number]  Primary Cardiologist:  None  Chief Complaint   Stacey Hensley is a 44 y.o. female who presents to the clinic today for ***  Patient Profile   Stacey Hensley is followed by Dr. Darliss for the history outlined below.      Past medical history significant for: Dyspnea. Echo 01/02/2023: EF 60 to 65%.  No RWMA.  Normal diastolic parameters.  Normal RV size/function.  No significant valvular abnormalities. Hypertension. Hyperlipidemia. Lipid panel 06/10/2023: LDL 150, HDL 39, TG 167, total 219. OSA. GERD. Obesity.  In summary, patient was first evaluated by Dr. Darliss on 12/09/2022 for hypertension and bilateral lower extremity edema.  Patient reported weight gain and dyspnea over 3 to 4 months.  She noted lower extremity edema and PCP started HCTZ with improved edema.  She denied chest pain.  BP was elevated at the time of her visit.  She was instructed to stop HCTZ and start torsemide .  Wegovy  was prescribed for weight loss.  Echo was ordered which demonstrated normal LV/RV function.  Upon follow-up in February 2025 patient reported improvement of edema with torsemide .  She had lost 14 pounds with the help of Wegovy  which she was tolerating well.  Patient was last seen in the office by Dr. Darliss on 07/04/2023 for routine follow-up.  Since her last visit in February she had developed a small bowel obstruction and Wegovy  was discontinued.  Edema continued to be adequately controlled with torsemide .  BP was well-controlled.  No medication changes were made.     History of Present Illness    Today, patient ***  Dyspnea/lower extremity edema Echo December 2024 demonstrated normal LV/RV function, normal diastolic parameters, no significant valvular abnormalities.  Patient*** - Continue torsemide .  Hypertension BP today*** - Continue lisinopril .  Hyperlipidemia LDL 150  June 2025, not at goal.  Patient was started on rosuvastatin  in June 2025. - Continue rosuvastatin . - Lipid panel and CMP***  Obesity Patient had initially done well on Wegovy  with weight loss.  Unfortunately she developed a small bowel obstruction and Wegovy  was discontinued.  She reports*** -***  ROS: All other systems reviewed and are otherwise negative except as noted in History of Present Illness.  EKGs/Labs Reviewed        06/10/2023: ALT 21; AST 22; BUN 9; Creatinine, Ser 0.92; Potassium 4.7; Sodium 138   06/10/2023: Hemoglobin 11.4; WBC 6.1   06/10/2023: TSH 0.837   No results found for requested labs within last 365 days.  ***  Risk Assessment/Calculations    {Does this patient have ATRIAL FIBRILLATION?[phone]} No BP recorded.  {Refresh Note OR Click here to enter BP  :1}***        Physical Exam    VS:  There were no vitals taken for this visit. , BMI There is no height or weight on file to calculate BMI.  GEN: Well nourished, well developed, in no acute distress. Neck: No JVD or carotid bruits. Cardiac: *** RRR. *** No murmur. No rubs or gallops.   Respiratory:  Respirations regular and unlabored. Clear to auscultation without rales, wheezing or rhonchi. GI: Soft, nontender, nondistended. Extremities: Radials/DP/PT 2+ and equal bilaterally. No clubbing or cyanosis. No edema ***  Skin: Warm and dry, no rash. Neuro: Strength intact.  Assessment & Plan   ***  Disposition: ***     {Are you ordering a CV Procedure (e.g. stress test, cath, DCCV, TEE,  etc)?   Press F2        :[id number]}   Signed, Barnie HERO. Osei Anger, DNP, NP-C  "

## 2024-01-16 ENCOUNTER — Ambulatory Visit: Admitting: Student

## 2024-01-28 NOTE — Progress Notes (Unsigned)
 "  Cardiology Clinic Note   Date: 01/28/2024 ID: Stacey Hensley, Stacey Hensley [birth year], MRN [id number]  Primary Cardiologist:  Redell Cave, MD  Chief Complaint   Stacey Hensley is a 44 y.o. female who presents to the clinic today for ***  Patient Profile   Stacey Hensley is followed by *** for the history outlined below.      Past medical history significant for: Dyspnea. Echo 01/02/2023: EF 60 to 65%.  No RWMA.  Normal diastolic parameters.  Normal RV size/function.  No significant valvular abnormalities. Hypertension. Hyperlipidemia. Lipid panel 06/10/2023: LDL 150, HDL 39, TG 167, total 219. OSA. GERD. Obesity.   In summary, patient was first evaluated by Dr. Cave on 12/09/2022 for hypertension and bilateral lower extremity edema.  Patient reported weight gain and dyspnea over 3 to 4 months.  She noted lower extremity edema and PCP started HCTZ with improved edema.  She denied chest pain.  BP was elevated at the time of her visit.  She was instructed to stop HCTZ and start torsemide .  Wegovy  was prescribed for weight loss.  Echo was ordered which demonstrated normal LV/RV function.  Upon follow-up in February 2025 patient reported improvement of edema with torsemide .  She had lost 14 pounds with the help of Wegovy  which she was tolerating well.   Patient was last seen in the office by Dr. Cave on 07/04/2023 for routine follow-up.  Since her last visit in February she had developed a small bowel obstruction and Wegovy  was discontinued.  Edema continued to be adequately controlled with torsemide .  BP was well-controlled.  No medication changes were made.     History of Present Illness    Today, patient ***  Dyspnea/lower extremity edema Echo December 2024 demonstrated normal LV/RV function, normal diastolic parameters, no significant valvular abnormalities.  Patient*** - Continue torsemide .   Hypertension BP today*** - Continue lisinopril .    Hyperlipidemia LDL 150 June 2025, not at goal.  Patient was started on rosuvastatin  in June 2025. - Continue rosuvastatin . - Lipid panel and CMP***   Obesity Patient had initially done well on Wegovy  with weight loss.  Unfortunately she developed a small bowel obstruction and Wegovy  was discontinued.  She reports*** -***  ROS: All other systems reviewed and are otherwise negative except as noted in History of Present Illness.  EKGs/Labs Reviewed        06/10/2023: ALT 21; AST 22; BUN 9; Creatinine, Ser 0.92; Potassium 4.7; Sodium 138   06/10/2023: Hemoglobin 11.4; WBC 6.1   06/10/2023: TSH 0.837   No results found for requested labs within last 365 days.  ***  Risk Assessment/Calculations    {Does this patient have ATRIAL FIBRILLATION?:[phone]} No BP recorded.  {Refresh Note OR Click here to enter BP  :1}***        Physical Exam    VS:  There were no vitals taken for this visit. , BMI There is no height or weight on file to calculate BMI.  GEN: Well nourished, well developed, in no acute distress. Neck: No JVD or carotid bruits. Cardiac: *** RRR. *** No murmur. No rubs or gallops.   Respiratory:  Respirations regular and unlabored. Clear to auscultation without rales, wheezing or rhonchi. GI: Soft, nontender, nondistended. Extremities: Radials/DP/PT 2+ and equal bilaterally. No clubbing or cyanosis. No edema ***  Skin: Warm and dry, no rash. Neuro: Strength intact.  Assessment & Plan   ***  Disposition: ***     {Are you ordering a CV Procedure (  e.g. stress test, cath, DCCV, TEE, etc)?   Press F2        :[id number]}   Signed, Barnie HERO. Rachael Ferrie, DNP, NP-C  "

## 2024-02-02 ENCOUNTER — Ambulatory Visit: Admitting: Student

## 2024-02-16 ENCOUNTER — Ambulatory Visit: Admitting: Student
